# Patient Record
Sex: Male | Born: 1950 | Race: White | Hispanic: No | Marital: Married | Smoking: Never smoker
Health system: Southern US, Community
[De-identification: ages and names within clinical notes are randomized; demographics above are authoritative.]

## PROBLEM LIST (undated history)

## (undated) DIAGNOSIS — J45909 Unspecified asthma, uncomplicated: Secondary | ICD-10-CM

## (undated) DIAGNOSIS — Z96649 Presence of unspecified artificial hip joint: Secondary | ICD-10-CM

## (undated) HISTORY — PX: NASAL SINUS SURGERY: SHX719

## (undated) HISTORY — DX: Unspecified asthma, uncomplicated: J45.909

## (undated) HISTORY — PX: AORTIC VALVE REPLACEMENT: SHX41

## (undated) HISTORY — DX: Presence of unspecified artificial hip joint: Z96.649

## (undated) HISTORY — PX: HIATAL HERNIA REPAIR: SHX195

## (undated) NOTE — *Deleted (*Deleted)
° °[address] °[place] [address] °Dept: [phone] ° °FOLLOW UP NOTE ° °Patient ID: Caleb Mills, male    DOB: 07/01/1951  Age: 69 y.o. MRN: [id number] °Date of Office Visit: 04/20/2020 ° °Assessment  °Chief Complaint: Asthma and Shortness of Breath ° °HPI °Caleb Mills is a 69-year-old male who presents to the clinic for follow-up visit.  He was last seen in this clinic on 03/24/2020 for evaluation of asthma, allergic rhinitis, allergic conjunctivitis, reflux, and cough.  At that time he was prescribed cefdinir and prednisone with relief of cough symptoms.  He reports his asthma as severe with symptoms including shortness of breath with any activity, decrease cough, and decreased wheeze.  He reports that he is unable to do any activity for greater than 2 minutes without stopping to use albuterol to decrease shortness of breath.  He reports this has been occurring since he had COVID-19 in August 2021.  He continues budesonide 0.5 mg twice a day and Perforomist twice a day via nebulizer.  He reports that he still uses albuterol frequently.  He continues Xolair 225 mg injections once every 2 weeks.  Allergic rhinitis is reported as doing much better with occasional nasal congestion and rhinorrhea for which he uses Flonase, azelastine, and saline nasal rinses as needed.  Allergic conjunctivitis is reported as well controlled with Pataday as needed.  Reflux is reported as well controlled while continuing dietary modifications and limiting coffee intake.  His current medications are listed in the chart. ° ° °Drug Allergies:  °Allergies  °Allergen Reactions  °• Augmentin [Amoxicillin-Pot Clavulanate] Nausea Only  °• Ciprofloxacin Other (See Comments)  °  Joint pain  °• Azelastine Other (See Comments)  °  Dry eyes  ° ° °Physical Exam: °BP 120/78    Pulse 77    Temp (!) 97.4 °F (36.3 °C) (Tympanic)    Resp 18    SpO2 98%   ° °Physical Exam °Vitals reviewed.  °Constitutional:   °   Appearance: Normal appearance. He is  well-developed.  °HENT:  °   Head: Normocephalic and atraumatic.  °   Right Ear: Tympanic membrane normal.  °   Left Ear: Tympanic membrane normal.  °   Nose:  °   Comments: Bilateral nares normal.  Septal deviation noted.  Pharynx normal.  Ears normal.  Eyes normal. °   Mouth/Throat:  °   Pharynx: Oropharynx is clear.  °Eyes:  °   Conjunctiva/sclera: Conjunctivae normal.  °Cardiovascular:  °   Rate and Rhythm: Normal rate and regular rhythm.  °   Heart sounds: Normal heart sounds. No murmur heard.  ° °Pulmonary:  °   Effort: Pulmonary effort is normal.  °   Breath sounds: Normal breath sounds.  °   Comments: Lungs clear to auscultation °Musculoskeletal:     °   General: Normal range of motion.  °   Cervical back: Normal range of motion and neck supple.  °Skin: °   General: Skin is warm and dry.  °Neurological:  °   Mental Status: He is alert and oriented to person, place, and time.  °Psychiatric:     °   Mood and Affect: Mood normal.     °   Behavior: Behavior normal.     °   Thought Content: Thought content normal.     °   Judgment: Judgment normal.  ° ° ° °Diagnostics: °FVC 2.93, FEV1 1.14.  Predicted FVC 3.57, predicted FEV1 2.60.  Spirometry indicates   severe airway obstruction.  This is consistent with previous spirometry readings. ° °Assessment and Plan: °1. Severe persistent asthma, unspecified whether complicated   °2. Other allergic rhinitis   °3. Gastroesophageal reflux disease, unspecified whether esophagitis present   °4. Seasonal allergic conjunctivitis   °5. History of aortic valve replacement   °6. Prophylactic use of warfarin for venous thromboembolism   °7. Asthma-COPD overlap syndrome (HCC)   ° ° °Patient Instructions  °Asthma °Continue montelukast 10 mg once a day to prevent cough or wheeze °For now, continue budesonide 0.5 mg twice a day via nebulizer and Perforomist twice a day via nebulizer. Evaluate these medications with Dr. Ejaz at your upcoming appointment °Continue albuterol 2 puffs every  4 hours as needed for cough or wheeze OR Instead use albuterol 0.083% solution via nebulizer one unit vial every 4 hours as needed for cough or wheeze °Continue Xolair injections once every 2 weeks  ° °Allergic rhinitis °Continue Flonase 2 sprays in each nostril once a day as needed for a stuffy nose °Continue azelastine 2 sprays in each nostril twice a day as needed for a runny nose °Consider saline nasal rinses as needed for nasal symptoms. Use this before any medicated nasal sprays for best result °Continue Claritin 10 mg once a day as needed for a runny nose °For thick nasal secretions, begin Mucinex 1200 mg twice a day and increase hydration as possible ° °Allergic conjunctivitis °Continue a lubricating eye drop °Some over the counter eye drops include Pataday one drop in each eye once a day as needed for red, itchy eyes OR Zaditor one drop in each eye twice a day as needed for red itchy eyes. ° °Reflux °Continue dietary and lifestyle modifications as listed below ° °Call the clinic if this treatment plan is not working well for you ° °Follow up in 6 weeks or sooner if needed. ° ° °Return in about 6 weeks (around 06/01/2020), or if symptoms worsen or fail to improve. °  ° °Thank you for the opportunity to care for this patient.  Please do not hesitate to contact me with questions. ° °Anne Ambs, FNP °[facility] ° ° ° ° ° °

---

## 1971-07-05 HISTORY — PX: APPENDECTOMY: SHX54

## 1994-07-04 HISTORY — PX: CHOLECYSTECTOMY: SHX55

## 2001-07-04 HISTORY — PX: THYROIDECTOMY: SHX17

## 2007-07-05 DIAGNOSIS — Z96649 Presence of unspecified artificial hip joint: Secondary | ICD-10-CM

## 2007-07-05 HISTORY — PX: ROTATOR CUFF REPAIR: SHX139

## 2007-07-05 HISTORY — DX: Presence of unspecified artificial hip joint: Z96.649

## 2007-07-05 HISTORY — PX: HIP RESURFACING: SHX1760

## 2007-07-05 HISTORY — PX: REPLACEMENT TOTAL HIP W/  RESURFACING IMPLANTS: SUR1222

## 2012-07-04 HISTORY — PX: HIP RESURFACING: SHX1760

## 2013-01-01 HISTORY — PX: TOTAL HIP ARTHROPLASTY: SHX124

## 2015-03-06 DIAGNOSIS — J3089 Other allergic rhinitis: Secondary | ICD-10-CM | POA: Insufficient documentation

## 2015-03-06 DIAGNOSIS — J4550 Severe persistent asthma, uncomplicated: Secondary | ICD-10-CM | POA: Insufficient documentation

## 2015-03-13 ENCOUNTER — Other Ambulatory Visit: Payer: Self-pay

## 2015-03-13 MED ORDER — OMALIZUMAB 150 MG ~~LOC~~ SOLR
225.0000 mg | SUBCUTANEOUS | Status: DC
  Administered 2015-04-02 – 2018-10-02 (×86): 225 mg via SUBCUTANEOUS

## 2015-03-31 ENCOUNTER — Ambulatory Visit: Payer: Medicare Other | Admitting: *Deleted

## 2015-04-02 ENCOUNTER — Ambulatory Visit (INDEPENDENT_AMBULATORY_CARE_PROVIDER_SITE_OTHER): Payer: Medicare Other | Admitting: *Deleted

## 2015-04-02 DIAGNOSIS — J4540 Moderate persistent asthma, uncomplicated: Secondary | ICD-10-CM | POA: Diagnosis not present

## 2015-04-02 DIAGNOSIS — J3089 Other allergic rhinitis: Secondary | ICD-10-CM

## 2015-04-03 DIAGNOSIS — J4540 Moderate persistent asthma, uncomplicated: Secondary | ICD-10-CM

## 2015-04-21 ENCOUNTER — Other Ambulatory Visit: Payer: Self-pay | Admitting: Allergy

## 2015-04-21 ENCOUNTER — Telehealth: Payer: Self-pay | Admitting: Allergy

## 2015-04-21 MED ORDER — ALBUTEROL SULFATE HFA 108 (90 BASE) MCG/ACT IN AERS: 2.0000 | INHALATION_SPRAY | Freq: Four times a day (QID) | RESPIRATORY_TRACT | Status: DC | PRN

## 2015-04-30 ENCOUNTER — Ambulatory Visit (INDEPENDENT_AMBULATORY_CARE_PROVIDER_SITE_OTHER): Payer: Medicare Other | Admitting: Pediatrics

## 2015-04-30 ENCOUNTER — Encounter: Payer: Self-pay | Admitting: Pediatrics

## 2015-04-30 VITALS — BP 110/80 | HR 72 | Temp 98.4°F | Resp 16 | Ht 64.17 in | Wt 157.0 lb

## 2015-04-30 DIAGNOSIS — J4550 Severe persistent asthma, uncomplicated: Secondary | ICD-10-CM | POA: Diagnosis not present

## 2015-04-30 DIAGNOSIS — J301 Allergic rhinitis due to pollen: Secondary | ICD-10-CM | POA: Diagnosis not present

## 2015-04-30 DIAGNOSIS — Z954 Presence of other heart-valve replacement: Secondary | ICD-10-CM

## 2015-04-30 DIAGNOSIS — Z952 Presence of prosthetic heart valve: Secondary | ICD-10-CM | POA: Insufficient documentation

## 2015-04-30 MED ORDER — ALBUTEROL SULFATE (2.5 MG/3ML) 0.083% IN NEBU: 2.5000 mg | INHALATION_SOLUTION | RESPIRATORY_TRACT | Status: DC | PRN

## 2015-04-30 NOTE — Patient Instructions (Signed)
Perforomist 20 mg by inhalation followed 20 minutes later by budesonide 0.5 mg twice a day Albuterol 0.083% one unit dose every 4 hours if needed for coughing or wheezing Ventolin 2 puffs every 4 hours if needed for coughing or wheezing when he is away from the nebulizer Continue Xolair injections every 2 weeks Claritin 10 mg once a day if needed for runny nose Fluticasone 2 sprays per nostril once a day if needed for stuffy nose He will call me if he is not doing well on this treatment plan. I feel that we should reduce the use of albuterol or Ventolin unless they're necessary because of shortness of breath

## 2015-04-30 NOTE — Progress Notes (Signed)
[address] [place] [address] Dept: [phone]  FOLLOW UP NOTE  Patient ID: Caleb Mills, male    DOB: [birth year]  Age: 64 y.o. MRN: [id number] Date of Office Visit: 04/30/2015  Assessment Chief Complaint: Follow-up  HPI Tyhir Schwan presents for follow-up of his severe asthma. He is on Xolair injections every 2 weeks and feels that they have helped him greatly. He stopped using Singulair and he did not note any deterioration in his breathing. He had a flu vaccination in September. In the past she has been allergic to dust mites, grass pollens, tree pollens, ragweed and weeds, and molds including Aspergillus.  Current medications are budesonide 0.5 one unit dose twice a day after using Perforomist 20 mg inhalation twice a day, albuterol 0.083% one unit dose every 4 hours if needed or instead Ventolin 2 puffs every 4 hours if needed. His pulmonologist in the past, wanted him to use Ventolin nebulizer before Perforomist.. He is also on Claritin 10 mg once a day if needed for runny nose and fluticasone 2 sprays per nostril once a day if needed for stuffy nose and on Coumadin. He had an aortic valve replaced in the past.   Drug Allergies:  Allergies  Allergen Reactions  . Augmentin [Amoxicillin-Pot Clavulanate] Nausea Only  . Spiriva Handihaler [Tiotropium Bromide Monohydrate] Other (See Comments)    Congestion worse    Physical Exam: BP 110/80 mmHg  Pulse 72  Temp(Src) 98.4 F (36.9 C) (Oral)  Resp 16  Ht 5' 4.17" (1.63 m)  Wt 156 lb 15.5 oz (71.2 kg)  BMI 26.80 kg/m2   Physical Exam  Constitutional: He is oriented to person, place, and time. He appears well-developed and well-nourished.  HENT:  Eyes normal. Ears normal. Nose normal. Pharynx normal.  Neck: Neck supple.  Cardiovascular:  S1 and S2 normal no murmurs  Pulmonary/Chest:  Clear to percussion and auscultation He had an increased anteroposterior diameter  Lymphadenopathy:    He has no cervical adenopathy.    Neurological: He is alert and oriented to person, place, and time.  Vitals reviewed.   Diagnostics:  Forced hot capacity 2.64 L FEV1 1.16 L. Predicted FVC 3.63 L predicted FEV1 2.69 L-this shows a mild reduction in the forced vital capacity. The FEV1 is 43% of predicted.  Assessment and Plan: 1. Severe persistent asthma, uncomplicated   2. Allergic rhinitis due to pollen   3. H/O aortic valve replacement     Meds ordered this encounter  Medications  . albuterol (PROVENTIL) (2.5 MG/3ML) 0.083% nebulizer solution    Sig: Take 3 mLs (2.5 mg total) by nebulization every 4 (four) hours as needed for wheezing or shortness of breath.    Dispense:  75 mL    Refill:  1    Patient Instructions  Perforomist 20 mg by inhalation followed 20 minutes later by budesonide 0.5 mg twice a day Albuterol 0.083% one unit dose every 4 hours if needed for coughing or wheezing Ventolin 2 puffs every 4 hours if needed for coughing or wheezing when he is away from the nebulizer Continue Xolair injections every 2 weeks Claritin 10 mg once a day if needed for runny nose Fluticasone 2 sprays per nostril once a day if needed for stuffy nose He will call me if he is not doing well on this treatment plan. I feel that we should reduce the use of albuterol or Ventolin unless they're necessary because of shortness of breath    Return in about 6 months (  around 10/29/2015).    Thank you for the opportunity to care for this patient.  Please do not hesitate to contact me with questions.  Tonette BihariJ. A. Grainger Mccarley, M.D.  [facility] [address] [place], [place] [id number] [phone]

## 2015-05-05 NOTE — Telephone Encounter (Signed)
ERRONEOUS ENCOUNTER

## 2015-05-14 ENCOUNTER — Ambulatory Visit (INDEPENDENT_AMBULATORY_CARE_PROVIDER_SITE_OTHER): Payer: Medicare Other | Admitting: *Deleted

## 2015-05-14 DIAGNOSIS — J4540 Moderate persistent asthma, uncomplicated: Secondary | ICD-10-CM | POA: Diagnosis not present

## 2015-05-15 DIAGNOSIS — J4540 Moderate persistent asthma, uncomplicated: Secondary | ICD-10-CM | POA: Diagnosis not present

## 2015-06-04 ENCOUNTER — Ambulatory Visit (INDEPENDENT_AMBULATORY_CARE_PROVIDER_SITE_OTHER): Payer: Medicare Other | Admitting: *Deleted

## 2015-06-04 DIAGNOSIS — J4550 Severe persistent asthma, uncomplicated: Secondary | ICD-10-CM | POA: Diagnosis not present

## 2015-06-05 ENCOUNTER — Other Ambulatory Visit: Payer: Self-pay

## 2015-06-05 DIAGNOSIS — J4550 Severe persistent asthma, uncomplicated: Secondary | ICD-10-CM | POA: Diagnosis not present

## 2015-06-05 MED ORDER — BUDESONIDE 0.5 MG/2ML IN SUSP: 0.5000 mg | Freq: Two times a day (BID) | RESPIRATORY_TRACT | Status: DC

## 2015-06-18 ENCOUNTER — Ambulatory Visit (INDEPENDENT_AMBULATORY_CARE_PROVIDER_SITE_OTHER): Payer: Medicare Other | Admitting: *Deleted

## 2015-06-18 DIAGNOSIS — J4540 Moderate persistent asthma, uncomplicated: Secondary | ICD-10-CM

## 2015-06-19 DIAGNOSIS — J4540 Moderate persistent asthma, uncomplicated: Secondary | ICD-10-CM | POA: Diagnosis not present

## 2015-07-02 ENCOUNTER — Ambulatory Visit (INDEPENDENT_AMBULATORY_CARE_PROVIDER_SITE_OTHER): Payer: Medicare Other | Admitting: *Deleted

## 2015-07-02 DIAGNOSIS — J4550 Severe persistent asthma, uncomplicated: Secondary | ICD-10-CM | POA: Diagnosis not present

## 2015-07-15 ENCOUNTER — Other Ambulatory Visit: Payer: Self-pay | Admitting: Allergy

## 2015-07-15 MED ORDER — ALBUTEROL SULFATE (2.5 MG/3ML) 0.083% IN NEBU: 2.5000 mg | INHALATION_SOLUTION | RESPIRATORY_TRACT | Status: DC | PRN

## 2015-07-15 NOTE — Telephone Encounter (Signed)
ORDERED PERFOROMIST INH SOL PER DR BARDELAS. SEVERE PERSISTENT ASTHMA- NEEDS TO AVOID HOSPITALIZATION.

## 2015-07-16 ENCOUNTER — Ambulatory Visit (INDEPENDENT_AMBULATORY_CARE_PROVIDER_SITE_OTHER): Payer: Medicare Other | Admitting: *Deleted

## 2015-07-16 DIAGNOSIS — J4540 Moderate persistent asthma, uncomplicated: Secondary | ICD-10-CM | POA: Diagnosis not present

## 2015-07-29 ENCOUNTER — Other Ambulatory Visit: Payer: Self-pay | Admitting: Allergy

## 2015-07-29 MED ORDER — FORMOTEROL FUMARATE 20 MCG/2ML IN NEBU: 20.0000 ug | INHALATION_SOLUTION | Freq: Two times a day (BID) | RESPIRATORY_TRACT | Status: DC

## 2015-07-30 ENCOUNTER — Ambulatory Visit (INDEPENDENT_AMBULATORY_CARE_PROVIDER_SITE_OTHER): Payer: Medicare Other | Admitting: *Deleted

## 2015-07-30 DIAGNOSIS — J4540 Moderate persistent asthma, uncomplicated: Secondary | ICD-10-CM

## 2015-07-31 DIAGNOSIS — J4540 Moderate persistent asthma, uncomplicated: Secondary | ICD-10-CM | POA: Diagnosis not present

## 2015-08-17 ENCOUNTER — Ambulatory Visit (INDEPENDENT_AMBULATORY_CARE_PROVIDER_SITE_OTHER): Payer: Medicare Other

## 2015-08-17 DIAGNOSIS — J4540 Moderate persistent asthma, uncomplicated: Secondary | ICD-10-CM | POA: Diagnosis not present

## 2015-08-18 DIAGNOSIS — J4540 Moderate persistent asthma, uncomplicated: Secondary | ICD-10-CM | POA: Diagnosis not present

## 2015-08-27 ENCOUNTER — Ambulatory Visit (INDEPENDENT_AMBULATORY_CARE_PROVIDER_SITE_OTHER): Payer: Medicare Other | Admitting: *Deleted

## 2015-08-27 DIAGNOSIS — J4540 Moderate persistent asthma, uncomplicated: Secondary | ICD-10-CM

## 2015-08-28 DIAGNOSIS — J4540 Moderate persistent asthma, uncomplicated: Secondary | ICD-10-CM | POA: Diagnosis not present

## 2015-09-10 ENCOUNTER — Ambulatory Visit (INDEPENDENT_AMBULATORY_CARE_PROVIDER_SITE_OTHER): Payer: Medicare Other | Admitting: *Deleted

## 2015-09-10 DIAGNOSIS — J4540 Moderate persistent asthma, uncomplicated: Secondary | ICD-10-CM

## 2015-09-11 DIAGNOSIS — J4540 Moderate persistent asthma, uncomplicated: Secondary | ICD-10-CM | POA: Diagnosis not present

## 2015-09-18 ENCOUNTER — Other Ambulatory Visit: Payer: Self-pay | Admitting: Pediatrics

## 2015-09-24 ENCOUNTER — Ambulatory Visit (INDEPENDENT_AMBULATORY_CARE_PROVIDER_SITE_OTHER): Payer: Medicare Other

## 2015-09-24 DIAGNOSIS — J4540 Moderate persistent asthma, uncomplicated: Secondary | ICD-10-CM

## 2015-09-25 DIAGNOSIS — J4540 Moderate persistent asthma, uncomplicated: Secondary | ICD-10-CM | POA: Diagnosis not present

## 2015-10-06 ENCOUNTER — Other Ambulatory Visit: Payer: Self-pay | Admitting: Allergy

## 2015-10-06 MED ORDER — FORMOTEROL FUMARATE 20 MCG/2ML IN NEBU: 20.0000 ug | INHALATION_SOLUTION | Freq: Two times a day (BID) | RESPIRATORY_TRACT | Status: DC

## 2015-10-07 ENCOUNTER — Other Ambulatory Visit: Payer: Self-pay | Admitting: Allergy

## 2015-10-07 MED ORDER — BUDESONIDE 0.5 MG/2ML IN SUSP: 0.5000 mg | Freq: Two times a day (BID) | RESPIRATORY_TRACT | Status: DC

## 2015-10-08 ENCOUNTER — Ambulatory Visit (INDEPENDENT_AMBULATORY_CARE_PROVIDER_SITE_OTHER): Payer: Medicare Other

## 2015-10-08 DIAGNOSIS — J4540 Moderate persistent asthma, uncomplicated: Secondary | ICD-10-CM | POA: Diagnosis not present

## 2015-10-09 ENCOUNTER — Other Ambulatory Visit: Payer: Self-pay

## 2015-10-09 DIAGNOSIS — J4540 Moderate persistent asthma, uncomplicated: Secondary | ICD-10-CM | POA: Diagnosis not present

## 2015-10-09 MED ORDER — BUDESONIDE 0.5 MG/2ML IN SUSP: 0.5000 mg | Freq: Two times a day (BID) | RESPIRATORY_TRACT | Status: DC

## 2015-10-21 DIAGNOSIS — J4540 Moderate persistent asthma, uncomplicated: Secondary | ICD-10-CM | POA: Diagnosis not present

## 2015-10-22 ENCOUNTER — Ambulatory Visit (INDEPENDENT_AMBULATORY_CARE_PROVIDER_SITE_OTHER): Payer: Medicare Other

## 2015-10-22 DIAGNOSIS — J4540 Moderate persistent asthma, uncomplicated: Secondary | ICD-10-CM

## 2015-10-29 ENCOUNTER — Ambulatory Visit (INDEPENDENT_AMBULATORY_CARE_PROVIDER_SITE_OTHER): Payer: Medicare Other | Admitting: Internal Medicine

## 2015-10-29 ENCOUNTER — Encounter: Payer: Self-pay | Admitting: Internal Medicine

## 2015-10-29 ENCOUNTER — Ambulatory Visit: Payer: Medicare Other | Admitting: Internal Medicine

## 2015-10-29 VITALS — BP 130/72 | HR 52 | Temp 98.2°F | Resp 16

## 2015-10-29 DIAGNOSIS — J4550 Severe persistent asthma, uncomplicated: Secondary | ICD-10-CM | POA: Diagnosis not present

## 2015-10-29 DIAGNOSIS — J3089 Other allergic rhinitis: Secondary | ICD-10-CM | POA: Diagnosis not present

## 2015-10-29 LAB — PULMONARY FUNCTION TEST

## 2015-10-29 NOTE — Assessment & Plan Note (Signed)
   On Xolair, currently well controlled  Continue budesonide 0.5 mg twice a day and formoterol 20 g twice a day. May wean to once a day if feeling well  Continue as needed albuterol  Has EpiPen and action plan

## 2015-10-29 NOTE — Progress Notes (Signed)
History of Present Illness: Caleb Mills is a 65 y.o. male presenting for follow-up   HPI Comments: Asthma on Xolair: Symptoms have been stable without any severe exacerbations since his last visit. He continues on budesonide 0.5 mg twice a day and formoterol 20 g twice a day. He is able to wean these to once a day when he is feeling well. He stopped Singulair due to no perceived benefit. He has not had any shot reactions and does feel benefit from his Xolair  Allergic rhinitis: Skin testing in the past was reportedly positive for tree, dust mite, cat, dog. This was performed an outside allergist. He has not had any interval sinus infections and has been having good symptom control   Current Outpatient Prescriptions on File Prior to Visit  Medication Sig Dispense Refill  . albuterol (PROVENTIL) (2.5 MG/3ML) 0.083% nebulizer solution Take 3 mLs (2.5 mg total) by nebulization every 4 (four) hours as needed for wheezing or shortness of breath. 75 mL 1  . Albuterol Sulfate (VENTOLIN HFA IN) Inhale into the lungs.    [name] EPINEPHrine (EPIPEN 2-PAK) 0.3 mg/0.3 mL IJ SOAJ injection Inject 0.3 mg into the muscle as needed.    . fluticasone (FLONASE) 50 MCG/ACT nasal spray Place 1 spray into both nostrils as needed (patient will take one to two sprays each nostril depending upon the weather).     [name] levothyroxine (SYNTHROID, LEVOTHROID) 100 MCG tablet TK 1 T PO D  1  . loratadine (CLARITIN) 10 MG tablet Take 10 mg by mouth daily.    . VENTOLIN HFA 108 (90 Base) MCG/ACT inhaler INHALE 2 PUFFS INTO THE LUNGS EVERY 6 HOURS AS NEEDED FOR WHEEZING OR SHORTNESS OF BREATH 18 g 0  . warfarin (COUMADIN) 10 MG tablet Take 10 mg by mouth. Patient takes warfarin  3 days per week and on other days   1  . formoterol (PERFOROMIST) 20 MCG/2ML nebulizer solution Take 2 mLs (20 mcg total) by nebulization 2 (two) times daily. 120 mL 1   Current Facility-Administered Medications on File Prior to Visit  Medication Dose  Route Frequency Provider Last Rate Last Dose  . omalizumab Geoffry Paradise) injection 225 mg  225 mg Subcutaneous Q14 Days Cristal Ford, MD   225 mg at 10/22/15 1419    Assessment and Plan: Allergic rhinitis  Currently well controlled  Continue loratadine (Claritin) 10 mg daily and fluticasone one spray each nostril daily as needed  Severe persistent asthma  On Xolair, currently well controlled  Continue budesonide 0.5 mg twice a day and formoterol 20 g twice a day. May wean to once a day if feeling well  Continue as needed albuterol  Has EpiPen and action plan   Return in about 6 months (around 04/29/2016).  Meds ordered this encounter  Medications  . budesonide (PULMICORT) 0.5 MG/2ML nebulizer solution    Sig:     Refill:  5    Diagnostics: Spirometry: FEV1 1.25 L or 46 %, FEV1/FVC  37 %.  This is consistent with severe airways obstruction. Partial reversibility in the past.  Physical Exam: BP 130/72 mmHg  Pulse 52  Temp(Src) 98.2 F (36.8 C) (Oral)  Resp 16   Physical Exam  Constitutional: He appears well-developed.  HENT:  Nose: Nose normal.  Mouth/Throat: Oropharynx is clear and moist.  Eyes: Conjunctivae are normal.  Cardiovascular: Normal rate, regular rhythm and normal heart sounds.   No murmur heard. Pulmonary/Chest: Effort normal and breath sounds normal. No respiratory distress. He has  no wheezes.  Abdominal: Soft. Bowel sounds are normal.  Musculoskeletal: He exhibits no edema.  Lymphadenopathy:    He has no cervical adenopathy.  Neurological: He is alert.  Skin: No rash noted.  Vitals reviewed.   Patient Active Problem List   Diagnosis Date Noted  . H/O aortic valve replacement 04/30/2015  . Allergic rhinitis 03/06/2015  . Severe persistent asthma 03/06/2015    Drug Allergies:  Allergies  Allergen Reactions  . Augmentin [Amoxicillin-Pot Clavulanate] Nausea Only  . Spiriva Handihaler [Tiotropium Bromide Monohydrate] Other (See  Comments)    Congestion worse    ROS: Per HPI unless specifically indicated below Review of Systems  Thank you for the opportunity to care for this patient.  Please do not hesitate to contact me with questions.

## 2015-10-29 NOTE — Assessment & Plan Note (Signed)
   Currently well controlled  Continue loratadine (Claritin) 10 mg daily and fluticasone one spray each nostril daily as needed

## 2015-10-29 NOTE — Patient Instructions (Signed)
Allergic rhinitis  Currently well controlled  Continue loratadine (Claritin) 10 mg daily and fluticasone one spray each nostril daily as needed  Severe persistent asthma  On Xolair, currently well controlled  Continue budesonide 0.5 mg twice a day and formoterol 20 g twice a day. May wean to once a day if feeling well  Continue as needed albuterol  Has EpiPen and action plan

## 2015-11-04 DIAGNOSIS — J4540 Moderate persistent asthma, uncomplicated: Secondary | ICD-10-CM | POA: Diagnosis not present

## 2015-11-05 ENCOUNTER — Ambulatory Visit (INDEPENDENT_AMBULATORY_CARE_PROVIDER_SITE_OTHER): Payer: Medicare Other | Admitting: *Deleted

## 2015-11-05 DIAGNOSIS — J4540 Moderate persistent asthma, uncomplicated: Secondary | ICD-10-CM

## 2015-11-19 ENCOUNTER — Ambulatory Visit (INDEPENDENT_AMBULATORY_CARE_PROVIDER_SITE_OTHER): Payer: Medicare Other | Admitting: *Deleted

## 2015-11-19 DIAGNOSIS — J4540 Moderate persistent asthma, uncomplicated: Secondary | ICD-10-CM | POA: Diagnosis not present

## 2015-11-20 DIAGNOSIS — J4540 Moderate persistent asthma, uncomplicated: Secondary | ICD-10-CM | POA: Diagnosis not present

## 2015-12-02 DIAGNOSIS — J4540 Moderate persistent asthma, uncomplicated: Secondary | ICD-10-CM | POA: Diagnosis not present

## 2015-12-03 ENCOUNTER — Ambulatory Visit (INDEPENDENT_AMBULATORY_CARE_PROVIDER_SITE_OTHER): Payer: Medicare Other | Admitting: *Deleted

## 2015-12-03 DIAGNOSIS — J4540 Moderate persistent asthma, uncomplicated: Secondary | ICD-10-CM

## 2015-12-17 ENCOUNTER — Ambulatory Visit (INDEPENDENT_AMBULATORY_CARE_PROVIDER_SITE_OTHER): Payer: Medicare Other

## 2015-12-17 DIAGNOSIS — J4540 Moderate persistent asthma, uncomplicated: Secondary | ICD-10-CM | POA: Diagnosis not present

## 2015-12-18 DIAGNOSIS — J4540 Moderate persistent asthma, uncomplicated: Secondary | ICD-10-CM | POA: Diagnosis not present

## 2015-12-25 ENCOUNTER — Other Ambulatory Visit: Payer: Self-pay

## 2015-12-25 MED ORDER — BUDESONIDE 0.5 MG/2ML IN SUSP: 0.5000 mg | Freq: Two times a day (BID) | RESPIRATORY_TRACT | Status: DC

## 2015-12-25 NOTE — Telephone Encounter (Signed)
Refilled pulmicort 0.5 mg x's 5 sent to [facility] on [address].

## 2015-12-31 ENCOUNTER — Ambulatory Visit (INDEPENDENT_AMBULATORY_CARE_PROVIDER_SITE_OTHER): Payer: Medicare Other | Admitting: *Deleted

## 2015-12-31 DIAGNOSIS — J4540 Moderate persistent asthma, uncomplicated: Secondary | ICD-10-CM

## 2016-01-01 DIAGNOSIS — J4540 Moderate persistent asthma, uncomplicated: Secondary | ICD-10-CM | POA: Diagnosis not present

## 2016-01-14 ENCOUNTER — Ambulatory Visit (INDEPENDENT_AMBULATORY_CARE_PROVIDER_SITE_OTHER): Payer: Medicare Other | Admitting: *Deleted

## 2016-01-14 DIAGNOSIS — J4540 Moderate persistent asthma, uncomplicated: Secondary | ICD-10-CM

## 2016-01-15 DIAGNOSIS — J4540 Moderate persistent asthma, uncomplicated: Secondary | ICD-10-CM | POA: Diagnosis not present

## 2016-01-28 ENCOUNTER — Ambulatory Visit (INDEPENDENT_AMBULATORY_CARE_PROVIDER_SITE_OTHER): Payer: Medicare Other | Admitting: *Deleted

## 2016-01-28 DIAGNOSIS — J4540 Moderate persistent asthma, uncomplicated: Secondary | ICD-10-CM

## 2016-01-29 DIAGNOSIS — J4540 Moderate persistent asthma, uncomplicated: Secondary | ICD-10-CM | POA: Diagnosis not present

## 2016-02-10 DIAGNOSIS — J4540 Moderate persistent asthma, uncomplicated: Secondary | ICD-10-CM | POA: Diagnosis not present

## 2016-02-11 ENCOUNTER — Ambulatory Visit (INDEPENDENT_AMBULATORY_CARE_PROVIDER_SITE_OTHER): Payer: Medicare Other

## 2016-02-11 DIAGNOSIS — J4540 Moderate persistent asthma, uncomplicated: Secondary | ICD-10-CM | POA: Diagnosis not present

## 2016-02-24 DIAGNOSIS — J4540 Moderate persistent asthma, uncomplicated: Secondary | ICD-10-CM | POA: Diagnosis not present

## 2016-02-25 ENCOUNTER — Ambulatory Visit (INDEPENDENT_AMBULATORY_CARE_PROVIDER_SITE_OTHER): Payer: Medicare Other

## 2016-02-25 DIAGNOSIS — J4540 Moderate persistent asthma, uncomplicated: Secondary | ICD-10-CM

## 2016-02-26 ENCOUNTER — Other Ambulatory Visit: Payer: Self-pay

## 2016-02-26 MED ORDER — FORMOTEROL FUMARATE 20 MCG/2ML IN NEBU: 20.0000 ug | INHALATION_SOLUTION | Freq: Two times a day (BID) | RESPIRATORY_TRACT | 0 refills | Status: DC

## 2016-03-01 ENCOUNTER — Other Ambulatory Visit: Payer: Self-pay | Admitting: Allergy

## 2016-03-01 MED ORDER — ALBUTEROL SULFATE HFA 108 (90 BASE) MCG/ACT IN AERS: 2.0000 | INHALATION_SPRAY | RESPIRATORY_TRACT | 1 refills | Status: DC | PRN

## 2016-03-09 DIAGNOSIS — J4540 Moderate persistent asthma, uncomplicated: Secondary | ICD-10-CM | POA: Diagnosis not present

## 2016-03-10 ENCOUNTER — Ambulatory Visit (INDEPENDENT_AMBULATORY_CARE_PROVIDER_SITE_OTHER): Payer: Medicare Other

## 2016-03-10 DIAGNOSIS — J4540 Moderate persistent asthma, uncomplicated: Secondary | ICD-10-CM

## 2016-03-24 ENCOUNTER — Ambulatory Visit (INDEPENDENT_AMBULATORY_CARE_PROVIDER_SITE_OTHER): Payer: Medicare Other

## 2016-03-24 DIAGNOSIS — J4540 Moderate persistent asthma, uncomplicated: Secondary | ICD-10-CM

## 2016-03-31 ENCOUNTER — Other Ambulatory Visit: Payer: Self-pay | Admitting: Allergy

## 2016-03-31 MED ORDER — FORMOTEROL FUMARATE 20 MCG/2ML IN NEBU: 20.0000 ug | INHALATION_SOLUTION | Freq: Two times a day (BID) | RESPIRATORY_TRACT | 0 refills | Status: DC

## 2016-04-06 DIAGNOSIS — J4540 Moderate persistent asthma, uncomplicated: Secondary | ICD-10-CM | POA: Diagnosis not present

## 2016-04-07 ENCOUNTER — Ambulatory Visit (INDEPENDENT_AMBULATORY_CARE_PROVIDER_SITE_OTHER): Payer: Medicare Other

## 2016-04-07 DIAGNOSIS — J4540 Moderate persistent asthma, uncomplicated: Secondary | ICD-10-CM | POA: Diagnosis not present

## 2016-04-20 DIAGNOSIS — J4540 Moderate persistent asthma, uncomplicated: Secondary | ICD-10-CM | POA: Diagnosis not present

## 2016-04-21 ENCOUNTER — Ambulatory Visit (INDEPENDENT_AMBULATORY_CARE_PROVIDER_SITE_OTHER): Payer: Medicare Other

## 2016-04-21 ENCOUNTER — Ambulatory Visit: Payer: Medicare Other

## 2016-04-21 DIAGNOSIS — J4540 Moderate persistent asthma, uncomplicated: Secondary | ICD-10-CM | POA: Diagnosis not present

## 2016-05-01 ENCOUNTER — Other Ambulatory Visit: Payer: Self-pay | Admitting: Pediatrics

## 2016-05-02 ENCOUNTER — Other Ambulatory Visit: Payer: Self-pay | Admitting: Allergy

## 2016-05-02 MED ORDER — FORMOTEROL FUMARATE 20 MCG/2ML IN NEBU: 20.0000 ug | INHALATION_SOLUTION | Freq: Two times a day (BID) | RESPIRATORY_TRACT | 0 refills | Status: DC

## 2016-05-02 MED ORDER — ALBUTEROL SULFATE HFA 108 (90 BASE) MCG/ACT IN AERS: 2.0000 | INHALATION_SPRAY | RESPIRATORY_TRACT | 1 refills | Status: DC | PRN

## 2016-05-04 DIAGNOSIS — J4550 Severe persistent asthma, uncomplicated: Secondary | ICD-10-CM | POA: Diagnosis not present

## 2016-05-04 DIAGNOSIS — J309 Allergic rhinitis, unspecified: Secondary | ICD-10-CM | POA: Diagnosis not present

## 2016-05-04 DIAGNOSIS — J4540 Moderate persistent asthma, uncomplicated: Secondary | ICD-10-CM | POA: Diagnosis not present

## 2016-05-04 DIAGNOSIS — R040 Epistaxis: Secondary | ICD-10-CM | POA: Diagnosis not present

## 2016-05-05 ENCOUNTER — Ambulatory Visit (INDEPENDENT_AMBULATORY_CARE_PROVIDER_SITE_OTHER): Payer: Medicare Other | Admitting: Allergy and Immunology

## 2016-05-05 ENCOUNTER — Ambulatory Visit (INDEPENDENT_AMBULATORY_CARE_PROVIDER_SITE_OTHER): Payer: Medicare Other

## 2016-05-05 ENCOUNTER — Encounter: Payer: Self-pay | Admitting: Allergy and Immunology

## 2016-05-05 VITALS — BP 130/82 | HR 72 | Temp 98.4°F | Resp 16

## 2016-05-05 DIAGNOSIS — R040 Epistaxis: Secondary | ICD-10-CM

## 2016-05-05 DIAGNOSIS — J4550 Severe persistent asthma, uncomplicated: Secondary | ICD-10-CM | POA: Diagnosis not present

## 2016-05-05 DIAGNOSIS — J309 Allergic rhinitis, unspecified: Secondary | ICD-10-CM | POA: Diagnosis not present

## 2016-05-05 DIAGNOSIS — J4540 Moderate persistent asthma, uncomplicated: Secondary | ICD-10-CM

## 2016-05-05 NOTE — Assessment & Plan Note (Signed)
Stable.  This patient will not use nasal steroid sprays due to epistaxis.  Continue appropriate allergen avoidance measures and loratadine 10 mg daily as needed.

## 2016-05-05 NOTE — Assessment & Plan Note (Signed)
   Nasal saline spray and/or nasal saline gel is recommended to moisturize nasal mucosa.  The use of a cool mist humidifier in the bedroom has been recommended  If this problem persists or progresses, otolaryngology evaluation may be warranted to search for/cauterize the culprit vessel.

## 2016-05-05 NOTE — Assessment & Plan Note (Signed)
I have discussed possibility of trying mepolizomab to see if he receives greater symptom relief.  Prior to doing so, he like to check to see what coverage is with his insurance as Xolair is currently covered 100%.  For now, continue omalizumab injections every 2 weeks, budesonide 1 g twice a day via nebulizer, formoterol 20 g twice a day via nebulizer, and albuterol every 4-6 hours as needed.  Subjective and objective measures of pulmonary function will be followed and the treatment plan will be adjusted accordingly.

## 2016-05-05 NOTE — Progress Notes (Signed)
Follow-up Note  RE: Caleb Mills MRN: [id number] DOB: 1950-09-15 Date of Office Visit: 05/05/2016  Primary care provider: Charlette CaffeyParuchuri, Lakshmi P, MD Referring provider: Charlette CaffeyParuchuri, Lakshmi P, MD  History of present illness: Caleb Mills is a 65 y.o. male with severe persistent asthma and allergic rhinitis presents today for follow up.  He was last seen in this clinic on 10/29/2015 by Dr. Clydie BraunBhatti who has since left the practice.  He receives omalizumab injections 2 times per month.  He started omalizumab approximately 8 years ago and reports that this treatment has significantly decreased his prednisone and antibiotic requirement.  He states that prior to starting Xolair he had required high-dose steroids as well as antibiotics approximately every 3 months on average for asthma exacerbations.  He currently takes budesonide 1 g via nebulizer twice a day and formoterol 20 g/2 ML twice a day via nebulizer.  On this regimen, he typically requires albuterol rescue one or 2 times per day, more often if he is exerting himself physically while at work.  He has failed Spiriva.  He reports that he had to discontinue fluticasone nasal spray due to frequent epistaxis.  At times, he experiences minor epistaxis on a daily basis.   Assessment and plan: Severe persistent asthma I have discussed possibility of trying mepolizomab to see if he receives greater symptom relief.  Prior to doing so, he like to check to see what coverage is with his insurance as Xolair is currently covered 100%.  For now, continue omalizumab injections every 2 weeks, budesonide 1 g twice a day via nebulizer, formoterol 20 g twice a day via nebulizer, and albuterol every 4-6 hours as needed.  Subjective and objective measures of pulmonary function will be followed and the treatment plan will be adjusted accordingly.  Allergic rhinitis Stable.  This patient will not use nasal steroid sprays due to epistaxis.  Continue appropriate allergen  avoidance measures and loratadine 10 mg daily as needed.  Epistaxis  Nasal saline spray and/or nasal saline gel is recommended to moisturize nasal mucosa.  The use of a cool mist humidifier in the bedroom has been recommended  If this problem persists or progresses, otolaryngology evaluation may be warranted to search for/cauterize the culprit vessel.   Diagnostics: Spirometry reveals an FVC of 2.47 L (69% predicted) and an FEV1 of 1.02 L (38% predicted) with significant (230 mL, 23%) post bronchodilator improvement.  Please see scanned spirometry results for details.    Physical examination: Blood pressure 130/82, pulse 72, temperature 98.4 F (36.9 C), temperature source Oral, resp. rate 16.  General: Alert, interactive, in no acute distress. HEENT: TMs pearly gray, turbinates mildly edematous without discharge, post-pharynx moderately erythematous. Neck: Supple without lymphadenopathy. Lungs: Decreased breath sounds bilaterally without wheezing, rhonchi or rales. CV: Normal S1, S2 without murmurs. Skin: Warm and dry, without lesions or rashes.  The following portions of the patient's history were reviewed and updated as appropriate: allergies, current medications, past family history, past medical history, past social history, past surgical history and problem list.    Medication List       Accurate as of 05/05/16  1:38 PM. Always use your most recent med list.          albuterol 108 (90 Base) MCG/ACT inhaler Commonly known as:  VENTOLIN HFA Inhale 2 puffs into the lungs every 4 (four) hours as needed for wheezing or shortness of breath.   albuterol (2.5 MG/3ML) 0.083% nebulizer solution Commonly known as:  PROVENTIL INHALE CONTENTS  OF ONE VIAL VIA NEBULIZER EVERY FOUR HOURS AS NEEDED FOR WHEEZING OR SHORTNESS OF BREATH   budesonide 0.5 MG/2ML nebulizer solution Commonly known as:  PULMICORT Take 2 mLs (0.5 mg total) by nebulization 2 (two) times daily.   EPIPEN  2-PAK 0.3 mg/0.3 mL Soaj injection Generic drug:  EPINEPHrine Inject 0.3 mg into the muscle as needed.   fluticasone 50 MCG/ACT nasal spray Commonly known as:  FLONASE Place 1 spray into both nostrils as needed (patient will take one to two sprays each nostril depending upon the weather).   formoterol 20 MCG/2ML nebulizer solution Commonly known as:  PERFOROMIST Take 2 mLs (20 mcg total) by nebulization 2 (two) times daily.   levothyroxine 100 MCG tablet Commonly known as:  SYNTHROID, LEVOTHROID TK 1 T PO D   loratadine 10 MG tablet Commonly known as:  CLARITIN Take 10 mg by mouth daily.   warfarin 10 MG tablet Commonly known as:  COUMADIN Take 10 mg by mouth. Patient takes warfarin 10mg  3 days per week and on other days 5mg        Allergies  Allergen Reactions  . Augmentin [Amoxicillin-Pot Clavulanate] Nausea Only  . Spiriva Handihaler [Tiotropium Bromide Monohydrate] Other (See Comments)    Congestion worse   Review of systems: Review of systems negative except as noted in HPI / PMHx or noted below: Constitutional: Negative.  HENT: Negative.   Eyes: Negative.  Respiratory: Negative.   Cardiovascular: Negative.  Gastrointestinal: Negative.  Genitourinary: Negative.  Musculoskeletal: Negative.  Neurological: Negative.  Endo/Heme/Allergies: Negative.  Cutaneous: Negative.  Past Medical History:  Diagnosis Date  . 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 Asthma Mother   . Allergic rhinitis Neg Hx   . Angioedema Neg Hx   . Atopy Neg Hx   . Eczema Neg Hx   . Immunodeficiency Neg Hx   . Urticaria Neg Hx     Social History   Social History  . Marital status: Married    Spouse name: N/A  . Number of children: N/A  . Years of education: N/A   Occupational History  . Not on file.   Social History Main Topics  . Smoking status: Never Smoker  . Smokeless tobacco: Never Used  . Alcohol use No  .  Drug use: No  . Sexual activity: Not on file   Other Topics Concern  . Not on file   Social History Narrative  . No narrative on file    I appreciate the opportunity to take part in [name]'s care. Please do not hesitate to contact me with questions.  Sincerely,   R. Jorene Guestarter Damico Partin, MD

## 2016-05-05 NOTE — Patient Instructions (Signed)
Severe persistent asthma I have discussed possibility of trying mepolizomab to see if he receives greater symptom relief.  Prior to doing so, he like to check to see what coverage is with his insurance as Xolair is currently covered 100%.  For now, continue omalizumab injections every 2 weeks, budesonide 1 g twice a day via nebulizer, formoterol 20 g twice a day via nebulizer, and albuterol every 4-6 hours as needed.  Subjective and objective measures of pulmonary function will be followed and the treatment plan will be adjusted accordingly.  Allergic rhinitis Stable.  This patient will not use nasal steroid sprays due to epistaxis.  Continue appropriate allergen avoidance measures and loratadine 10 mg daily as needed.  Epistaxis  Nasal saline spray and/or nasal saline gel is recommended to moisturize nasal mucosa.  The use of a cool mist humidifier in the bedroom has been recommended  If this problem persists or progresses, otolaryngology evaluation may be warranted to search for/cauterize the culprit vessel.   Return in about 4 months (around 09/02/2016), or if symptoms worsen or fail to improve.

## 2016-05-16 ENCOUNTER — Ambulatory Visit (INDEPENDENT_AMBULATORY_CARE_PROVIDER_SITE_OTHER): Payer: Medicare Other

## 2016-05-16 DIAGNOSIS — J4540 Moderate persistent asthma, uncomplicated: Secondary | ICD-10-CM | POA: Diagnosis not present

## 2016-05-17 ENCOUNTER — Other Ambulatory Visit: Payer: Self-pay | Admitting: Allergy

## 2016-05-17 DIAGNOSIS — J4540 Moderate persistent asthma, uncomplicated: Secondary | ICD-10-CM | POA: Diagnosis not present

## 2016-05-19 ENCOUNTER — Ambulatory Visit: Payer: Medicare Other

## 2016-05-20 ENCOUNTER — Telehealth: Payer: Self-pay

## 2016-05-20 NOTE — Telephone Encounter (Signed)
Dr Nunzio CobbsBobbitt, Patient called me back.  He talked things over with his wife and he has decided to stay on Xolair and not forge ahead with change to [facility].  He is doing good on Xolair and his insurance is paying 100%.  Please discontinue [facility] process [facility]. Thanks! Gwenneth Whiteman

## 2016-05-23 NOTE — Telephone Encounter (Signed)
Noted. Thanks.

## 2016-05-30 ENCOUNTER — Other Ambulatory Visit: Payer: Self-pay

## 2016-05-30 NOTE — Telephone Encounter (Signed)
Patient called.  Coughing and wheezing with yellow sputum Sx. X one week.  Patient feels congestion is getting deeper in chest area today.  Albuterol, Pulmicort Respules and Performist are only helping for about 2 hours, then cough and wheeze begin again.Patient states  prednisone and antibiotics work well for this.  Can we call meds in.  [facility]. [address] in [place].

## 2016-05-30 NOTE — Telephone Encounter (Signed)
Prednisone: 40 mg x3 days, 30 x 3 days, 20 mg x3 day, 10 mg x3 day, 5 mg x 3 days, then stop. Azithromycin, 500 mg on day 1 and 250 mg on days 2 through 5.  Follow up if symptoms persist or progress.

## 2016-05-31 MED ORDER — AZITHROMYCIN 250 MG PO TABS: ORAL_TABLET | ORAL | 0 refills | Status: DC

## 2016-05-31 MED ORDER — PREDNISONE 10 MG PO TABS: ORAL_TABLET | ORAL | 0 refills | Status: DC

## 2016-05-31 NOTE — Addendum Note (Signed)
Addended by: Maryjean MornFREEMAN, Lido Maske D on: 05/31/2016 10:27 AM   Modules accepted: Orders

## 2016-06-01 DIAGNOSIS — J4540 Moderate persistent asthma, uncomplicated: Secondary | ICD-10-CM | POA: Diagnosis not present

## 2016-06-02 ENCOUNTER — Ambulatory Visit (INDEPENDENT_AMBULATORY_CARE_PROVIDER_SITE_OTHER): Payer: Medicare Other

## 2016-06-02 DIAGNOSIS — J4540 Moderate persistent asthma, uncomplicated: Secondary | ICD-10-CM | POA: Diagnosis not present

## 2016-06-15 ENCOUNTER — Other Ambulatory Visit: Payer: Self-pay | Admitting: Allergy

## 2016-06-15 DIAGNOSIS — J4540 Moderate persistent asthma, uncomplicated: Secondary | ICD-10-CM

## 2016-06-15 MED ORDER — ALBUTEROL SULFATE (2.5 MG/3ML) 0.083% IN NEBU: INHALATION_SOLUTION | RESPIRATORY_TRACT | 1 refills | Status: DC

## 2016-06-16 ENCOUNTER — Ambulatory Visit (INDEPENDENT_AMBULATORY_CARE_PROVIDER_SITE_OTHER): Payer: Medicare Other

## 2016-06-16 DIAGNOSIS — J4540 Moderate persistent asthma, uncomplicated: Secondary | ICD-10-CM | POA: Diagnosis not present

## 2016-06-29 DIAGNOSIS — J4540 Moderate persistent asthma, uncomplicated: Secondary | ICD-10-CM

## 2016-06-30 ENCOUNTER — Ambulatory Visit: Payer: Medicare Other

## 2016-06-30 ENCOUNTER — Ambulatory Visit (INDEPENDENT_AMBULATORY_CARE_PROVIDER_SITE_OTHER): Payer: Medicare Other

## 2016-06-30 DIAGNOSIS — J4540 Moderate persistent asthma, uncomplicated: Secondary | ICD-10-CM | POA: Diagnosis not present

## 2016-07-01 ENCOUNTER — Other Ambulatory Visit: Payer: Self-pay | Admitting: *Deleted

## 2016-07-01 MED ORDER — FORMOTEROL FUMARATE 20 MCG/2ML IN NEBU: 20.0000 ug | INHALATION_SOLUTION | Freq: Two times a day (BID) | RESPIRATORY_TRACT | 2 refills | Status: DC

## 2016-07-13 ENCOUNTER — Telehealth: Payer: Self-pay | Admitting: Allergy

## 2016-07-13 NOTE — Telephone Encounter (Signed)
na

## 2016-07-14 ENCOUNTER — Encounter: Payer: Self-pay | Admitting: Allergy and Immunology

## 2016-07-14 ENCOUNTER — Ambulatory Visit: Payer: Medicare Other

## 2016-07-14 ENCOUNTER — Other Ambulatory Visit: Payer: Self-pay

## 2016-07-14 ENCOUNTER — Ambulatory Visit (INDEPENDENT_AMBULATORY_CARE_PROVIDER_SITE_OTHER): Payer: Medicare Other | Admitting: Allergy and Immunology

## 2016-07-14 VITALS — BP 116/76 | HR 62 | Temp 98.3°F | Resp 16

## 2016-07-14 DIAGNOSIS — J45901 Unspecified asthma with (acute) exacerbation: Secondary | ICD-10-CM

## 2016-07-14 DIAGNOSIS — J309 Allergic rhinitis, unspecified: Secondary | ICD-10-CM

## 2016-07-14 DIAGNOSIS — R040 Epistaxis: Secondary | ICD-10-CM | POA: Diagnosis not present

## 2016-07-14 MED ORDER — ALBUTEROL SULFATE (2.5 MG/3ML) 0.083% IN NEBU: INHALATION_SOLUTION | RESPIRATORY_TRACT | 1 refills | Status: DC

## 2016-07-14 MED ORDER — PREDNISONE 1 MG PO TABS: 10.0000 mg | ORAL_TABLET | Freq: Every day | ORAL | Status: DC

## 2016-07-14 MED ORDER — AZITHROMYCIN 500 MG PO TABS: ORAL_TABLET | ORAL | 0 refills | Status: DC

## 2016-07-14 NOTE — Assessment & Plan Note (Addendum)
   Prednisone has been provided: 50 mg 2 days, 40 mg 2 days, 30 mg 2 days, 20 mg 2 days, 10 mg 2 days, then stop.  A prescription has been provided for azithromycin, 500 mg on day 1 and 250 mg on days 2 through 5. He know to decrease his coumadin dose while on azithromycin.  For now, continue budesonide 1 g twice a day via nebulizer, formoterol 20 g twice a day via nebulizer, and albuterol every 4-6 hours as needed.  Resume omalizumab after exacerbation has resolved.  The patient has been asked to contact me if his symptoms persist or progress. Otherwise, he may return for follow up in 4 months.

## 2016-07-14 NOTE — Progress Notes (Signed)
Follow-up Note  RE: Caleb Mills MRN: [id number] DOB: 1950-12-21 Date of Office Visit: 07/14/2016  Primary care provider: Charlette CaffeyParuchuri, Lakshmi P, MD Referring provider: Charlette CaffeyParuchuri, Lakshmi P, MD  History of present illness: Caleb Mills is a 66 y.o. male with severe persistent asthma and allergic rhinitis resenting today for sick visit.  He was last seen in this clinic on 05/05/2016.  He reports that over the past few weeks he has been experiencing frequent lower respiration symptoms including coughing, dyspnea, chest tightness, and wheezing.  He also reports that he is expectorating "greenish yellow mucus."  He states that he was recently in contact with a relative with a respiratory tract infection.  He has no significant nasal or sinus symptoms.  He still experiences occasional epistaxis, however maintains that he is able to use his nasal steroid every few days without problems.   Assessment and plan: Asthma with acute exacerbation  Prednisone has been provided: 50 mg 2 days, 40 mg 2 days, 30 mg 2 days, 20 mg 2 days, 10 mg 2 days, then stop.  A prescription has been provided for azithromycin, 500 mg on day 1 and 250 mg on days 2 through 5. He know to decrease his coumadin dose while on azithromycin.  For now, continue budesonide 1 g twice a day via nebulizer, formoterol 20 g twice a day via nebulizer, and albuterol every 4-6 hours as needed.  Resume omalizumab after exacerbation has resolved.  The patient has been asked to contact me if his symptoms persist or progress. Otherwise, he may return for follow up in 4 months.  Allergic rhinitis  Continue appropriate allergen avoidance measures and loratadine 10 mg daily as needed.  I have also recommended nasal saline spray (i.e. Simply Saline) as needed.  History of epistaxis History of epistaxis in patient on coumadin.  Avoid nasal steroid sprays.  Nasal saline spray and/or nasal saline gel is recommended to moisturize nasal  mucosa.  The use of a cool mist humidifier in the bedroom has been recommended.  During epistaxis, oxymetazoline may be used to help stanch blood flow if needed.  If this problem persists or progresses, otolaryngology evaluation may be warranted to search for/cauterize the culprit vessel.   Meds ordered this encounter  Medications  . azithromycin (ZITHROMAX) 500 MG tablet    Sig: Take 500 mg on day 1, then 250 mg on day 2 through 5.    Dispense:  2 tablet    Refill:  0  . predniSONE (DELTASONE) tablet 10 mg    Diagnostics: Spirometry reveals an FVC of 2.75 L (see 76% predicted and an FEV1 of 1.06 L (40% predicted) with 6% post bronchodilator improvement.  Please see scanned spirometry results for details.    Physical examination: Blood pressure 116/76, pulse 62, temperature 98.3 F (36.8 C), temperature source Oral, resp. rate 16.  General: Alert, interactive, in no acute distress. HEENT: TMs pearly gray, turbinates moderately edematous without discharge, post-pharynx mildly erythematous. Neck: Supple without lymphadenopathy. Lungs: Decreased breath sounds bilaterally without wheezing, rhonchi or rales. CV: Normal S1, S2 without murmurs. Skin: Warm and dry, without lesions or rashes.  The following portions of the patient's history were reviewed and updated as appropriate: allergies, current medications, past family history, past medical history, past social history, past surgical history and problem list.  Allergies as of 07/14/2016      Reactions   Augmentin [amoxicillin-pot Clavulanate] Nausea Only   Spiriva Handihaler [tiotropium Bromide Monohydrate] Other (See Comments)   Congestion  worse      Medication List       Accurate as of 07/14/16  2:22 PM. Always use your most recent med list.          albuterol 108 (90 Base) MCG/ACT inhaler Commonly known as:  VENTOLIN HFA Inhale 2 puffs into the lungs every 4 (four) hours as needed for wheezing or shortness of  breath.   albuterol (2.5 MG/3ML) 0.083% nebulizer solution Commonly known as:  PROVENTIL INHALE CONTENTS OF ONE VIAL VIA NEBULIZER EVERY FOUR HOURS AS NEEDED FOR WHEEZING OR SHORTNESS OF BREATH   azithromycin 500 MG tablet Commonly known as:  ZITHROMAX Take 500 mg on day 1, then 250 mg on day 2 through 5.   budesonide 0.5 MG/2ML nebulizer solution Commonly known as:  PULMICORT Take 2 mLs (0.5 mg total) by nebulization 2 (two) times daily.   EPIPEN 2-PAK 0.3 mg/0.3 mL Soaj injection Generic drug:  EPINEPHrine Inject 0.3 mg into the muscle as needed.   fluticasone 50 MCG/ACT nasal spray Commonly known as:  FLONASE Place 1 spray into both nostrils as needed (patient will take one to two sprays each nostril depending upon the weather).   formoterol 20 MCG/2ML nebulizer solution Commonly known as:  PERFOROMIST Take 2 mLs (20 mcg total) by nebulization 2 (two) times daily.   levothyroxine 100 MCG tablet Commonly known as:  SYNTHROID, LEVOTHROID TK 1 T PO D   loratadine 10 MG tablet Commonly known as:  CLARITIN Take 10 mg by mouth daily.   pravastatin 40 MG tablet Commonly known as:  PRAVACHOL   warfarin 10 MG tablet Commonly known as:  COUMADIN Take 10 mg by mouth. Patient takes warfarin 10mg  3 days per week and on other days 5mg        Allergies  Allergen Reactions  . Augmentin [Amoxicillin-Pot Clavulanate] Nausea Only  . Spiriva Handihaler [Tiotropium Bromide Monohydrate] Other (See Comments)    Congestion worse   Review of systems: Review of systems negative except as noted in HPI / PMHx or noted below: Constitutional: Negative.  HENT: Negative.   Eyes: Negative.  Respiratory: Negative.   Cardiovascular: Negative.  Gastrointestinal: Negative.  Genitourinary: Negative.  Musculoskeletal: Negative.  Neurological: Negative.  Endo/Heme/Allergies: Negative.  Cutaneous: Negative.  Past Medical History:  Diagnosis Date  . 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 Asthma Mother   . Allergic rhinitis Neg Hx   . Angioedema Neg Hx   . Atopy Neg Hx   . Eczema Neg Hx   . Immunodeficiency Neg Hx   . Urticaria Neg Hx     Social History   Social History  . Marital status: Married    Spouse name: N/A  . Number of children: N/A  . Years of education: N/A   Occupational History  . Not on file.   Social History Main Topics  . Smoking status: Never Smoker  . Smokeless tobacco: Never Used  . Alcohol use No  . Drug use: No  . Sexual activity: Not on file   Other Topics Concern  . Not on file   Social History Narrative  . No narrative on file    I appreciate the opportunity to take part in Miko's care. Please do not hesitate to contact me with questions.  Sincerely,   R. Jorene Guest, MD

## 2016-07-14 NOTE — Assessment & Plan Note (Addendum)
History of epistaxis in patient on coumadin.  Avoid nasal steroid sprays.  Nasal saline spray and/or nasal saline gel is recommended to moisturize nasal mucosa.  The use of a cool mist humidifier in the bedroom has been recommended.  During epistaxis, oxymetazoline may be used to help stanch blood flow if needed.  If this problem persists or progresses, otolaryngology evaluation may be warranted to search for/cauterize the culprit vessel.

## 2016-07-14 NOTE — Assessment & Plan Note (Addendum)
   Continue appropriate allergen avoidance measures and loratadine 10 mg daily as needed.  I have also recommended nasal saline spray (i.e. Simply Saline) as needed.

## 2016-07-14 NOTE — Patient Instructions (Addendum)
Asthma with acute exacerbation  Prednisone has been provided: 50 mg 2 days, 40 mg 2 days, 30 mg 2 days, 20 mg 2 days, 10 mg 2 days, then stop.  A prescription has been provided for azithromycin, 500 mg on day 1 and 250 mg on days 2 through 5. He know to decrease his coumadin dose while on azithromycin.  For now, continue budesonide 1 g twice a day via nebulizer, formoterol 20 g twice a day via nebulizer, and albuterol every 4-6 hours as needed.  Resume omalizumab after exacerbation has resolved.  The patient has been asked to contact me if his symptoms persist or progress. Otherwise, he may return for follow up in 4 months.  Allergic rhinitis  Continue appropriate allergen avoidance measures and loratadine 10 mg daily as needed.  I have also recommended nasal saline spray (i.e. Simply Saline) as needed.  History of epistaxis History of epistaxis in patient on coumadin.  Avoid nasal steroid sprays.  Nasal saline spray and/or nasal saline gel is recommended to moisturize nasal mucosa.  The use of a cool mist humidifier in the bedroom has been recommended.  During epistaxis, oxymetazoline may be used to help stanch blood flow if needed.  If this problem persists or progresses, otolaryngology evaluation may be warranted to search for/cauterize the culprit vessel.   Return in about 4 months (around 11/11/2016), or if symptoms worsen or fail to improve.

## 2016-07-15 ENCOUNTER — Telehealth: Payer: Self-pay

## 2016-07-15 ENCOUNTER — Telehealth: Payer: Self-pay | Admitting: *Deleted

## 2016-07-15 NOTE — Telephone Encounter (Signed)
Cristal Fordalph Carter Bobbitt, MD  [facility]; [facility]        Fayrene FearingJames apparently had blood work done by his cardiologist sometime over the past few months, please check to see if a CBC with diff was done. We will be assessing his candidacy for benralizumab.     I have contacted the cardiologist office and they are faxing over all labs he had done in oct. I will look and see if there is a CBC.

## 2016-07-15 NOTE — Telephone Encounter (Signed)
-----   Message from Cristal Fordalph Carter Bobbitt, MD sent at 07/14/2016  2:16 PM EST ----- Fayrene FearingJames apparently had blood work done by his cardiologist sometime over the past few months, please check to see if a CBC with diff was done. We will be assessing his candidacy for benralizumab.

## 2016-07-18 NOTE — Telephone Encounter (Addendum)
Called patient.  [facility] [phone] on [address]  [place] for Dr. Stann Oreasario.  Faxed a request to get labs. Received.  Scanned lab report in EPIC.    [facility]

## 2016-07-18 NOTE — Telephone Encounter (Signed)
Having trouble locating it in Epic. Can you fax it to [place] or put it on my desk in [place]? Thanks.

## 2016-07-19 NOTE — Telephone Encounter (Signed)
Sent message to Tammy to start approval.

## 2016-07-19 NOTE — Telephone Encounter (Signed)
Elevated eosinophils. Excellent candidate for benralizumab. Please submit for approval.

## 2016-07-19 NOTE — Telephone Encounter (Signed)
Found blood test in Epic. Placed records on your desk.

## 2016-07-21 ENCOUNTER — Ambulatory Visit: Payer: Medicare Other

## 2016-07-25 ENCOUNTER — Telehealth: Payer: Self-pay

## 2016-07-25 ENCOUNTER — Ambulatory Visit (INDEPENDENT_AMBULATORY_CARE_PROVIDER_SITE_OTHER): Payer: Medicare Other

## 2016-07-25 DIAGNOSIS — J4540 Moderate persistent asthma, uncomplicated: Secondary | ICD-10-CM | POA: Diagnosis not present

## 2016-07-25 NOTE — Telephone Encounter (Signed)
Calling to have xolair mixed at first told him his xolair wasn't here, but called pt. Back and let him know that his Geoffry Paradisexolair was here.

## 2016-07-26 DIAGNOSIS — J4540 Moderate persistent asthma, uncomplicated: Secondary | ICD-10-CM

## 2016-07-28 ENCOUNTER — Ambulatory Visit: Payer: Medicare Other

## 2016-08-07 DIAGNOSIS — J4540 Moderate persistent asthma, uncomplicated: Secondary | ICD-10-CM | POA: Diagnosis not present

## 2016-08-08 ENCOUNTER — Ambulatory Visit (INDEPENDENT_AMBULATORY_CARE_PROVIDER_SITE_OTHER): Payer: Medicare Other

## 2016-08-08 DIAGNOSIS — J4540 Moderate persistent asthma, uncomplicated: Secondary | ICD-10-CM | POA: Diagnosis not present

## 2016-08-19 DIAGNOSIS — J4540 Moderate persistent asthma, uncomplicated: Secondary | ICD-10-CM

## 2016-08-22 ENCOUNTER — Ambulatory Visit (INDEPENDENT_AMBULATORY_CARE_PROVIDER_SITE_OTHER): Payer: Medicare Other

## 2016-08-22 DIAGNOSIS — J4540 Moderate persistent asthma, uncomplicated: Secondary | ICD-10-CM | POA: Diagnosis not present

## 2016-09-02 ENCOUNTER — Other Ambulatory Visit: Payer: Self-pay

## 2016-09-02 DIAGNOSIS — J4540 Moderate persistent asthma, uncomplicated: Secondary | ICD-10-CM | POA: Diagnosis not present

## 2016-09-02 MED ORDER — ALBUTEROL SULFATE (2.5 MG/3ML) 0.083% IN NEBU: INHALATION_SOLUTION | RESPIRATORY_TRACT | 1 refills | Status: DC

## 2016-09-05 ENCOUNTER — Ambulatory Visit (INDEPENDENT_AMBULATORY_CARE_PROVIDER_SITE_OTHER): Payer: Medicare Other

## 2016-09-05 ENCOUNTER — Other Ambulatory Visit: Payer: Self-pay | Admitting: Allergy

## 2016-09-05 DIAGNOSIS — J4540 Moderate persistent asthma, uncomplicated: Secondary | ICD-10-CM | POA: Diagnosis not present

## 2016-09-19 DIAGNOSIS — J4540 Moderate persistent asthma, uncomplicated: Secondary | ICD-10-CM | POA: Diagnosis not present

## 2016-09-20 ENCOUNTER — Ambulatory Visit (INDEPENDENT_AMBULATORY_CARE_PROVIDER_SITE_OTHER): Payer: Medicare Other

## 2016-09-20 DIAGNOSIS — J4540 Moderate persistent asthma, uncomplicated: Secondary | ICD-10-CM | POA: Diagnosis not present

## 2016-10-04 ENCOUNTER — Ambulatory Visit: Payer: Self-pay

## 2016-10-05 DIAGNOSIS — J4540 Moderate persistent asthma, uncomplicated: Secondary | ICD-10-CM

## 2016-10-06 ENCOUNTER — Ambulatory Visit (INDEPENDENT_AMBULATORY_CARE_PROVIDER_SITE_OTHER): Payer: Medicare Other

## 2016-10-06 DIAGNOSIS — J4540 Moderate persistent asthma, uncomplicated: Secondary | ICD-10-CM | POA: Diagnosis not present

## 2016-10-07 ENCOUNTER — Other Ambulatory Visit: Payer: Self-pay

## 2016-10-07 MED ORDER — ALBUTEROL SULFATE HFA 108 (90 BASE) MCG/ACT IN AERS: 2.0000 | INHALATION_SPRAY | RESPIRATORY_TRACT | 1 refills | Status: DC | PRN

## 2016-10-11 ENCOUNTER — Telehealth: Payer: Self-pay | Admitting: Allergy

## 2016-10-11 NOTE — Telephone Encounter (Signed)
.   Patient  called and said insurance  gave him pro-air instead of ventolin. We will try to get PA on ventolin. Patient said he was not getting enough Budesonide to last a month, was having to pick up every fifteen days. Called in prescription for Budesonide 0.5% no refills. And Perforomist  76mcg/2ml  no refills. Informed patient.

## 2016-10-11 NOTE — Telephone Encounter (Signed)
Okay. Sure that he has enough budesonide to last 30 days

## 2016-10-12 NOTE — Telephone Encounter (Signed)
OK 

## 2016-10-19 ENCOUNTER — Other Ambulatory Visit: Payer: Self-pay | Admitting: Allergy

## 2016-10-19 MED ORDER — ALBUTEROL SULFATE (2.5 MG/3ML) 0.083% IN NEBU: INHALATION_SOLUTION | RESPIRATORY_TRACT | 2 refills | Status: DC

## 2016-10-20 ENCOUNTER — Ambulatory Visit (INDEPENDENT_AMBULATORY_CARE_PROVIDER_SITE_OTHER): Payer: Medicare Other

## 2016-10-20 DIAGNOSIS — J4540 Moderate persistent asthma, uncomplicated: Secondary | ICD-10-CM | POA: Diagnosis not present

## 2016-10-21 ENCOUNTER — Ambulatory Visit: Payer: Self-pay

## 2016-11-02 DIAGNOSIS — J4540 Moderate persistent asthma, uncomplicated: Secondary | ICD-10-CM | POA: Diagnosis not present

## 2016-11-03 ENCOUNTER — Ambulatory Visit (INDEPENDENT_AMBULATORY_CARE_PROVIDER_SITE_OTHER): Payer: Medicare Other | Admitting: Allergy and Immunology

## 2016-11-03 ENCOUNTER — Encounter: Payer: Self-pay | Admitting: Allergy and Immunology

## 2016-11-03 ENCOUNTER — Ambulatory Visit (INDEPENDENT_AMBULATORY_CARE_PROVIDER_SITE_OTHER): Payer: Medicare Other

## 2016-11-03 VITALS — BP 134/78 | HR 66 | Temp 98.1°F | Resp 16

## 2016-11-03 DIAGNOSIS — J3089 Other allergic rhinitis: Secondary | ICD-10-CM

## 2016-11-03 DIAGNOSIS — J4540 Moderate persistent asthma, uncomplicated: Secondary | ICD-10-CM

## 2016-11-03 DIAGNOSIS — J4550 Severe persistent asthma, uncomplicated: Secondary | ICD-10-CM | POA: Diagnosis not present

## 2016-11-03 NOTE — Progress Notes (Signed)
Follow-up Note  RE: Caleb Mills MRN: [id number] DOB: Dec 31, 1950 Date of Office Visit: 11/03/2016  Primary care provider: Charlette Caffey, MD Referring provider: Charlette Caffey, MD  History of present illness: Caleb Mills is a 66 y.o. male with persistent asthma on Xolair and allergic rhinitis presenting today for follow up.  He was last seen in this clinic on 07/14/2016.  He reports that since the weather has warmed up his asthma has been well-controlled with his current treatment plan.  He typically uses albuterol 15 minutes prior to vigorous exertion related to work.  He believes that Ventolin HFA works better than ProAir HFA and is wondering if we can see about switching him back to Ventolin.  He has no nasal symptom complaints today and reports that his nasal symptoms are typically well-controlled with fluticasone nasal spray and loratadine.   Assessment and plan: Severe persistent asthma  Continue omalizumab injections, budesonide 0.5 g twice a day via nebulizer, formoterol 20 g twice a day via nebulizer, and albuterol every 4-6 hours as needed as well as 15 minutes prior to vigorous exertion.  Subjective and objective measures of pulmonary function will be followed and the treatment plan will be adjusted accordingly.  Allergic rhinitis Stable.  Continue appropriate allergen avoidance measures, fluticasone nasal spray as needed, and loratadine 10 mg daily as needed.  I have also recommended nasal saline spray (i.e. Simply Saline) as needed.   Diagnostics: Spirometry reveals an FVC of 2.58 L and an FEV1 of 1.14 L.  Mild restrictive and severe obstructive pattern consistent with previous studies.  Please see scanned spirometry results for details.    Physical examination: Blood pressure 134/78, pulse 66, temperature 98.1 F (36.7 C), temperature source Oral, resp. rate 16, SpO2 97 %.  General: Alert, interactive, in no acute distress. HEENT: TMs pearly gray,  turbinates mildly edematous without discharge, post-pharynx mildly erythematous. Neck: Supple without lymphadenopathy. Lungs: Decreased breath sounds bilaterally without wheezing, rhonchi or rales. CV: Normal S1, S2 without murmurs. Skin: Warm and dry, without lesions or rashes.  The following portions of the patient's history were reviewed and updated as appropriate: allergies, current medications, past family history, past medical history, past social history, past surgical history and problem list.  Allergies as of 11/03/2016      Reactions   Augmentin [amoxicillin-pot Clavulanate] Nausea Only   Spiriva Handihaler [tiotropium Bromide Monohydrate] Other (See Comments)   Congestion worse      Medication List       Accurate as of 11/03/16  1:29 PM. Always use your most recent med list.          albuterol 108 (90 Base) MCG/ACT inhaler Commonly known as:  VENTOLIN HFA Inhale 2 puffs into the lungs every 4 (four) hours as needed for wheezing or shortness of breath.   albuterol (2.5 MG/3ML) 0.083% nebulizer solution Commonly known as:  PROVENTIL INHALE CONTENTS OF ONE VIAL VIA NEBULIZER EVERY FOUR HOURS AS NEEDED FOR WHEEZING OR SHORTNESS OF BREATH   budesonide 0.5 MG/2ML nebulizer solution Commonly known as:  PULMICORT Take 2 mLs (0.5 mg total) by nebulization 2 (two) times daily.   EPIPEN 2-PAK 0.3 mg/0.3 mL Soaj injection Generic drug:  EPINEPHrine Inject 0.3 mg into the muscle as needed.   fluticasone 50 MCG/ACT nasal spray Commonly known as:  FLONASE Place 1 spray into both nostrils as needed (patient will take one to two sprays each nostril depending upon the weather).   formoterol 20 MCG/2ML nebulizer solution Commonly  known as:  PERFOROMIST Take 2 mLs (20 mcg total) by nebulization 2 (two) times daily.   levothyroxine 100 MCG tablet Commonly known as:  SYNTHROID, LEVOTHROID TK 1 T PO D   loratadine 10 MG tablet Commonly known as:  CLARITIN Take 10 mg by mouth  daily.   pravastatin 40 MG tablet Commonly known as:  PRAVACHOL   warfarin 10 MG tablet Commonly known as:  COUMADIN Take 10 mg by mouth. Patient takes warfarin 10mg  3 days per week and on other days 5mg        Allergies  Allergen Reactions  . Augmentin [Amoxicillin-Pot Clavulanate] Nausea Only  . Spiriva Handihaler [Tiotropium Bromide Monohydrate] Other (See Comments)    Congestion worse    I appreciate the opportunity to take part in Caleb Mills's care. Please do not hesitate to contact me with questions.  Sincerely,   R. Jorene Guestarter Ramsay Bognar, MD

## 2016-11-03 NOTE — Assessment & Plan Note (Signed)
   Continue omalizumab injections, budesonide 0.5 g twice a day via nebulizer, formoterol 20 g twice a day via nebulizer, and albuterol every 4-6 hours as needed as well as 15 minutes prior to vigorous exertion.  Subjective and objective measures of pulmonary function will be followed and the treatment plan will be adjusted accordingly.

## 2016-11-03 NOTE — Assessment & Plan Note (Signed)
Stable.  Continue appropriate allergen avoidance measures, fluticasone nasal spray as needed, and loratadine 10 mg daily as needed.  I have also recommended nasal saline spray (i.e. Simply Saline) as needed.

## 2016-11-03 NOTE — Patient Instructions (Signed)
Severe persistent asthma  Continue omalizumab injections, budesonide 0.5 g twice a day via nebulizer, formoterol 20 g twice a day via nebulizer, and albuterol every 4-6 hours as needed as well as 15 minutes prior to vigorous exertion.  Subjective and objective measures of pulmonary function will be followed and the treatment plan will be adjusted accordingly.  Allergic rhinitis Stable.  Continue appropriate allergen avoidance measures, fluticasone nasal spray as needed, and loratadine 10 mg daily as needed.  I have also recommended nasal saline spray (i.e. Simply Saline) as needed.   Return in about 4 months (around 03/06/2017), or if symptoms worsen or fail to improve.

## 2016-11-16 DIAGNOSIS — J4540 Moderate persistent asthma, uncomplicated: Secondary | ICD-10-CM

## 2016-11-17 ENCOUNTER — Ambulatory Visit (INDEPENDENT_AMBULATORY_CARE_PROVIDER_SITE_OTHER): Payer: Medicare Other | Admitting: *Deleted

## 2016-11-17 DIAGNOSIS — J4540 Moderate persistent asthma, uncomplicated: Secondary | ICD-10-CM

## 2016-11-25 DIAGNOSIS — J4540 Moderate persistent asthma, uncomplicated: Secondary | ICD-10-CM | POA: Diagnosis not present

## 2016-11-29 ENCOUNTER — Ambulatory Visit (INDEPENDENT_AMBULATORY_CARE_PROVIDER_SITE_OTHER): Payer: Medicare Other

## 2016-11-29 DIAGNOSIS — J4540 Moderate persistent asthma, uncomplicated: Secondary | ICD-10-CM

## 2016-12-01 ENCOUNTER — Ambulatory Visit: Payer: Self-pay

## 2016-12-13 ENCOUNTER — Ambulatory Visit: Payer: Self-pay

## 2016-12-28 DIAGNOSIS — J4540 Moderate persistent asthma, uncomplicated: Secondary | ICD-10-CM

## 2016-12-29 ENCOUNTER — Other Ambulatory Visit: Payer: Self-pay

## 2016-12-29 ENCOUNTER — Ambulatory Visit (INDEPENDENT_AMBULATORY_CARE_PROVIDER_SITE_OTHER): Payer: Medicare Other

## 2016-12-29 DIAGNOSIS — J4540 Moderate persistent asthma, uncomplicated: Secondary | ICD-10-CM | POA: Diagnosis not present

## 2016-12-29 MED ORDER — BUDESONIDE 0.5 MG/2ML IN SUSP: 0.5000 mg | Freq: Two times a day (BID) | RESPIRATORY_TRACT | 3 refills | Status: DC

## 2016-12-29 MED ORDER — FORMOTEROL FUMARATE 20 MCG/2ML IN NEBU: 20.0000 ug | INHALATION_SOLUTION | Freq: Two times a day (BID) | RESPIRATORY_TRACT | 3 refills | Status: DC

## 2016-12-29 NOTE — Telephone Encounter (Signed)
Ok refill Perforomist x 4. Last ov 11/03/16.

## 2016-12-29 NOTE — Telephone Encounter (Signed)
Ok refill budesonide 0.5 mg x 4. Last ov 11/03/16.

## 2017-01-02 ENCOUNTER — Other Ambulatory Visit: Payer: Self-pay

## 2017-01-02 MED ORDER — BUDESONIDE 0.5 MG/2ML IN SUSP: 0.5000 mg | Freq: Two times a day (BID) | RESPIRATORY_TRACT | 3 refills | Status: DC

## 2017-01-02 MED ORDER — ALBUTEROL SULFATE (2.5 MG/3ML) 0.083% IN NEBU: INHALATION_SOLUTION | RESPIRATORY_TRACT | 1 refills | Status: DC

## 2017-01-02 NOTE — Telephone Encounter (Signed)
Refilled albuterol 0.083% x's 1 and pulmicort 0.5mg /672ml x's 3.

## 2017-01-09 ENCOUNTER — Ambulatory Visit: Payer: Self-pay

## 2017-01-09 DIAGNOSIS — J4540 Moderate persistent asthma, uncomplicated: Secondary | ICD-10-CM | POA: Diagnosis not present

## 2017-01-10 ENCOUNTER — Ambulatory Visit (INDEPENDENT_AMBULATORY_CARE_PROVIDER_SITE_OTHER): Payer: Medicare Other

## 2017-01-10 DIAGNOSIS — J4540 Moderate persistent asthma, uncomplicated: Secondary | ICD-10-CM

## 2017-01-11 ENCOUNTER — Ambulatory Visit: Payer: Self-pay

## 2017-01-12 ENCOUNTER — Ambulatory Visit: Payer: Self-pay

## 2017-01-24 ENCOUNTER — Ambulatory Visit: Payer: Self-pay

## 2017-01-25 DIAGNOSIS — J4540 Moderate persistent asthma, uncomplicated: Secondary | ICD-10-CM | POA: Diagnosis not present

## 2017-01-26 ENCOUNTER — Ambulatory Visit (INDEPENDENT_AMBULATORY_CARE_PROVIDER_SITE_OTHER): Payer: Medicare Other

## 2017-01-26 DIAGNOSIS — J4540 Moderate persistent asthma, uncomplicated: Secondary | ICD-10-CM

## 2017-02-03 ENCOUNTER — Other Ambulatory Visit: Payer: Self-pay

## 2017-02-03 MED ORDER — ALBUTEROL SULFATE (2.5 MG/3ML) 0.083% IN NEBU: INHALATION_SOLUTION | RESPIRATORY_TRACT | 1 refills | Status: DC

## 2017-02-08 DIAGNOSIS — J4540 Moderate persistent asthma, uncomplicated: Secondary | ICD-10-CM | POA: Diagnosis not present

## 2017-02-09 ENCOUNTER — Ambulatory Visit (INDEPENDENT_AMBULATORY_CARE_PROVIDER_SITE_OTHER): Payer: Medicare Other

## 2017-02-09 ENCOUNTER — Ambulatory Visit: Payer: Self-pay

## 2017-02-09 DIAGNOSIS — J4540 Moderate persistent asthma, uncomplicated: Secondary | ICD-10-CM | POA: Diagnosis not present

## 2017-02-22 DIAGNOSIS — J4540 Moderate persistent asthma, uncomplicated: Secondary | ICD-10-CM | POA: Diagnosis not present

## 2017-02-23 ENCOUNTER — Ambulatory Visit (INDEPENDENT_AMBULATORY_CARE_PROVIDER_SITE_OTHER): Payer: Medicare Other

## 2017-02-23 DIAGNOSIS — J4540 Moderate persistent asthma, uncomplicated: Secondary | ICD-10-CM

## 2017-03-07 DIAGNOSIS — J4540 Moderate persistent asthma, uncomplicated: Secondary | ICD-10-CM

## 2017-03-08 ENCOUNTER — Ambulatory Visit (INDEPENDENT_AMBULATORY_CARE_PROVIDER_SITE_OTHER): Payer: Medicare Other | Admitting: *Deleted

## 2017-03-08 DIAGNOSIS — J4540 Moderate persistent asthma, uncomplicated: Secondary | ICD-10-CM | POA: Diagnosis not present

## 2017-03-15 ENCOUNTER — Other Ambulatory Visit: Payer: Self-pay | Admitting: Allergy

## 2017-03-15 MED ORDER — ALBUTEROL SULFATE (2.5 MG/3ML) 0.083% IN NEBU: INHALATION_SOLUTION | RESPIRATORY_TRACT | 1 refills | Status: DC

## 2017-03-21 DIAGNOSIS — J4540 Moderate persistent asthma, uncomplicated: Secondary | ICD-10-CM

## 2017-03-22 ENCOUNTER — Ambulatory Visit (INDEPENDENT_AMBULATORY_CARE_PROVIDER_SITE_OTHER): Payer: Medicare Other

## 2017-03-22 DIAGNOSIS — J4540 Moderate persistent asthma, uncomplicated: Secondary | ICD-10-CM | POA: Diagnosis not present

## 2017-04-04 DIAGNOSIS — J4540 Moderate persistent asthma, uncomplicated: Secondary | ICD-10-CM

## 2017-04-05 ENCOUNTER — Ambulatory Visit (INDEPENDENT_AMBULATORY_CARE_PROVIDER_SITE_OTHER): Payer: Medicare Other

## 2017-04-05 DIAGNOSIS — J4540 Moderate persistent asthma, uncomplicated: Secondary | ICD-10-CM

## 2017-04-18 DIAGNOSIS — J4540 Moderate persistent asthma, uncomplicated: Secondary | ICD-10-CM | POA: Diagnosis not present

## 2017-04-19 ENCOUNTER — Ambulatory Visit (INDEPENDENT_AMBULATORY_CARE_PROVIDER_SITE_OTHER): Payer: Medicare Other

## 2017-04-19 DIAGNOSIS — J4540 Moderate persistent asthma, uncomplicated: Secondary | ICD-10-CM

## 2017-04-21 ENCOUNTER — Other Ambulatory Visit: Payer: Self-pay

## 2017-04-21 MED ORDER — ALBUTEROL SULFATE (2.5 MG/3ML) 0.083% IN NEBU: INHALATION_SOLUTION | RESPIRATORY_TRACT | 1 refills | Status: DC

## 2017-05-02 DIAGNOSIS — J4540 Moderate persistent asthma, uncomplicated: Secondary | ICD-10-CM

## 2017-05-03 ENCOUNTER — Ambulatory Visit (INDEPENDENT_AMBULATORY_CARE_PROVIDER_SITE_OTHER): Payer: Medicare Other

## 2017-05-03 DIAGNOSIS — J4540 Moderate persistent asthma, uncomplicated: Secondary | ICD-10-CM

## 2017-05-11 ENCOUNTER — Encounter: Payer: Self-pay | Admitting: Allergy and Immunology

## 2017-05-11 ENCOUNTER — Ambulatory Visit (INDEPENDENT_AMBULATORY_CARE_PROVIDER_SITE_OTHER): Payer: Medicare Other | Admitting: Allergy and Immunology

## 2017-05-11 VITALS — BP 120/90 | HR 70 | Temp 97.7°F | Resp 20

## 2017-05-11 DIAGNOSIS — J3089 Other allergic rhinitis: Secondary | ICD-10-CM

## 2017-05-11 DIAGNOSIS — J4550 Severe persistent asthma, uncomplicated: Secondary | ICD-10-CM

## 2017-05-11 DIAGNOSIS — R040 Epistaxis: Secondary | ICD-10-CM

## 2017-05-11 MED ORDER — ALBUTEROL SULFATE (2.5 MG/3ML) 0.083% IN NEBU: INHALATION_SOLUTION | RESPIRATORY_TRACT | 3 refills | Status: DC

## 2017-05-11 MED ORDER — BUDESONIDE 0.5 MG/2ML IN SUSP: 0.5000 mg | Freq: Two times a day (BID) | RESPIRATORY_TRACT | 3 refills | Status: DC

## 2017-05-11 NOTE — Patient Instructions (Signed)
Severe persistent asthma Stable.  Continue omalizumab injections, budesonide 0.5 g twice a day via nebulizer, formoterol 20 g twice a day via nebulizer, and albuterol every 4-6 hours as needed as well as 15 minutes prior to exertion.  Subjective and objective measures of pulmonary function will be followed and the treatment plan will be adjusted accordingly.  Allergic rhinitis Currently with suboptimal control.  Continue appropriate allergen avoidance measures and fluticasone nasal spray 2 sprays per nostril daily as needed.  Nasal saline lavage (NeilMed) has been recommended as needed and prior to medicated nasal sprays along with instructions for proper administration.  For thick post nasal drainage, add guaifenesin [phone] mg (Mucinex)  twice daily as needed with adequate hydration as discussed.  History of epistaxis Currently quiescent.  Proper nasal spray technique has been discussed and demonstrated.  He verbalized understanding.  Nasal saline rinse, spray, and/or nasal saline gel is recommended to moisturize nasal mucosa.  The use of a cool mist humidifier in the bedroom has been recommended.  During epistaxis, oxymetazoline may be used to help stanch blood flow if needed.  If this problem recurs, persists, or progresses, otolaryngology evaluation may be warranted to search for/cauterize the culprit vessel.   Return in about 4 months (around 09/08/2017), or if symptoms worsen or fail to improve.

## 2017-05-11 NOTE — Assessment & Plan Note (Signed)
Currently quiescent.  Proper nasal spray technique has been discussed and demonstrated.  He verbalized understanding.  Nasal saline rinse, spray, and/or nasal saline gel is recommended to moisturize nasal mucosa.  The use of a cool mist humidifier in the bedroom has been recommended.  During epistaxis, oxymetazoline may be used to help stanch blood flow if needed.  If this problem recurs, persists, or progresses, otolaryngology evaluation may be warranted to search for/cauterize the culprit vessel.

## 2017-05-11 NOTE — Progress Notes (Signed)
Follow-up Note  RE: Caleb Mills MRN: [id number] DOB: 12-Oct-1950 Date of Office Visit: 05/11/2017  Primary care provider: Charlette Caffey, MD Referring provider: Charlette Caffey, MD  History of present illness: Caleb Mills is a 66 y.o. male with persistent asthma on Xolair and allergic rhinitis presenting today for follow up. He reports that his asthma has been relatively stable while complying with his current asthma regimen.  He requires albuterol with moderate exertion, such as climbing 1 flight of stairs.  If he knows he is going to be exerting himself at work, he will use albuterol prophylactically.  He states that Xolair has provided significant improvement.  He did not take his asthma medications this morning prior to his visit. He attempts to control his nasal allergy symptoms with fluticasone nasal spray, however he has noted increased postnasal drainage his wife has complained that he has bad breath recently.  He denies epistaxis in the interval since his previous visit.   Assessment and plan: Severe persistent asthma Stable.  Continue omalizumab injections, budesonide 0.5 g twice a day via nebulizer, formoterol 20 g twice a day via nebulizer, and albuterol every 4-6 hours as needed as well as 15 minutes prior to exertion.  Subjective and objective measures of pulmonary function will be followed and the treatment plan will be adjusted accordingly.  Allergic rhinitis Currently with suboptimal control.  Continue appropriate allergen avoidance measures and fluticasone nasal spray 2 sprays per nostril daily as needed.  Nasal saline lavage (NeilMed) has been recommended as needed and prior to medicated nasal sprays along with instructions for proper administration.  For thick post nasal drainage, add guaifenesin [phone] mg (Mucinex)  twice daily as needed with adequate hydration as discussed.  History of epistaxis Currently quiescent.  Proper nasal spray technique  has been discussed and demonstrated.  He verbalized understanding.  Nasal saline rinse, spray, and/or nasal saline gel is recommended to moisturize nasal mucosa.  The use of a cool mist humidifier in the bedroom has been recommended.  During epistaxis, oxymetazoline may be used to help stanch blood flow if needed.  If this problem recurs, persists, or progresses, otolaryngology evaluation may be warranted to search for/cauterize the culprit vessel.   Meds ordered this encounter  Medications  . albuterol (PROVENTIL) (2.5 MG/3ML) 0.083% nebulizer solution    Sig: INHALE CONTENTS OF ONE VIAL VIA NEBULIZER EVERY FOUR HOURS AS NEEDED FOR WHEEZING OR SHORTNESS OF BREATH    Dispense:  75 mL    Refill:  3    Please dispense 3 boxes  . budesonide (PULMICORT) 0.5 MG/2ML nebulizer solution    Sig: Take 2 mLs (0.5 mg total) 2 (two) times daily by nebulization.    Dispense:  120 mL    Refill:  3    To prevent cough or wheeze.    Diagnostics: Prematurity reveals an FVC of 2.01 L and an FEV1 of 0.88 L with an FEV1 ratio of 59%.  FEV1 is down 240 mL compared with his previous study, however he admits that he has not taken his asthma medications yet today.  Please see scanned spirometry results for details.    Physical examination: Blood pressure 120/90, pulse 70, temperature 97.7 F (36.5 C), temperature source Oral, resp. rate 20, SpO2 96 %.  General: Alert, interactive, in no acute distress. HEENT: TMs pearly gray, turbinates moderately edematous with thick discharge, post-pharynx erythematous. Neck: Supple without lymphadenopathy. Lungs: Decreased breath sounds bilaterally without wheezing, rhonchi or rales. CV: Normal S1,  S2 without murmurs. Skin: Warm and dry, without lesions or rashes.  The following portions of the patient's history were reviewed and updated as appropriate: allergies, current medications, past family history, past medical history, past social history, past surgical  history and problem list.  Allergies as of 05/11/2017      Reactions   Augmentin [amoxicillin-pot Clavulanate] Nausea Only   Spiriva Handihaler [tiotropium Bromide Monohydrate] Other (See Comments)   Congestion worse      Medication List        Accurate as of 05/11/17 12:19 PM. Always use your most recent med list.          albuterol 108 (90 Base) MCG/ACT inhaler Commonly known as:  VENTOLIN HFA Inhale 2 puffs into the lungs every 4 (four) hours as needed for wheezing or shortness of breath.   albuterol (2.5 MG/3ML) 0.083% nebulizer solution Commonly known as:  PROVENTIL INHALE CONTENTS OF ONE VIAL VIA NEBULIZER EVERY FOUR HOURS AS NEEDED FOR WHEEZING OR SHORTNESS OF BREATH   budesonide 0.5 MG/2ML nebulizer solution Commonly known as:  PULMICORT Take 2 mLs (0.5 mg total) 2 (two) times daily by nebulization.   EPIPEN 2-PAK 0.3 mg/0.3 mL Soaj injection Generic drug:  EPINEPHrine Inject 0.3 mg into the muscle as needed.   fluticasone 50 MCG/ACT nasal spray Commonly known as:  FLONASE Place 1 spray into both nostrils as needed (patient will take one to two sprays each nostril depending upon the weather).   formoterol 20 MCG/2ML nebulizer solution Commonly known as:  PERFOROMIST Take 2 mLs (20 mcg total) by nebulization 2 (two) times daily.   levothyroxine 100 MCG tablet Commonly known as:  SYNTHROID, LEVOTHROID TK 1 T PO D   loratadine 10 MG tablet Commonly known as:  CLARITIN Take 10 mg by mouth daily.   multivitamin tablet Take 1 tablet daily by mouth.   pravastatin 40 MG tablet Commonly known as:  PRAVACHOL   warfarin 10 MG tablet Commonly known as:  COUMADIN Take 10 mg by mouth. Patient takes warfarin 10mg  3 days per week and on other days 5mg        Allergies  Allergen Reactions  . Augmentin [Amoxicillin-Pot Clavulanate] Nausea Only  . Spiriva Handihaler [Tiotropium Bromide Monohydrate] Other (See Comments)    Congestion worse   Review of  systems: Review of systems negative except as noted in HPI / PMHx or noted below: Constitutional: Negative.  HENT: Negative.   Eyes: Negative.  Respiratory: Negative.   Cardiovascular: Negative.  Gastrointestinal: Negative.  Genitourinary: Negative.  Musculoskeletal: Negative.  Neurological: Negative.  Endo/Heme/Allergies: Negative.  Cutaneous: Negative.  Past Medical History:  Diagnosis Date  . 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 Asthma Mother   . Allergic rhinitis Neg Hx   . Angioedema Neg Hx   . Atopy Neg Hx   . Eczema Neg Hx   . Immunodeficiency Neg Hx   . Urticaria Neg Hx     Social History   Socioeconomic History  . Marital status: Married    Spouse name: Not on file  . Number of children: Not on file  . Years of education: Not on file  . Highest education level: Not on file  Social Needs  . Financial resource strain: Not on file  . Food insecurity - worry: Not on file  . Food insecurity - inability: Not on file  . Transportation needs - medical: Not on file  .  Transportation needs - non-medical: Not on file  Occupational History  . Not on file  Tobacco Use  . Smoking status: Never Smoker  . Smokeless tobacco: Never Used  Substance and Sexual Activity  . Alcohol use: No    Alcohol/week: 0.0 oz  . Drug use: No  . Sexual activity: Not on file  Other Topics Concern  . Not on file  Social History Narrative  . Not on file    I appreciate the opportunity to take part in Avrian's care. Please do not hesitate to contact me with questions.  Sincerely,   R. Jorene Guestarter Conception Doebler, MD

## 2017-05-11 NOTE — Assessment & Plan Note (Signed)
Stable.  Continue omalizumab injections, budesonide 0.5 g twice a day via nebulizer, formoterol 20 g twice a day via nebulizer, and albuterol every 4-6 hours as needed as well as 15 minutes prior to exertion.  Subjective and objective measures of pulmonary function will be followed and the treatment plan will be adjusted accordingly.

## 2017-05-11 NOTE — Assessment & Plan Note (Signed)
Currently with suboptimal control.  Continue appropriate allergen avoidance measures and fluticasone nasal spray 2 sprays per nostril daily as needed.  Nasal saline lavage (NeilMed) has been recommended as needed and prior to medicated nasal sprays along with instructions for proper administration.  For thick post nasal drainage, add guaifenesin [phone] mg (Mucinex)  twice daily as needed with adequate hydration as discussed.

## 2017-05-16 DIAGNOSIS — J4540 Moderate persistent asthma, uncomplicated: Secondary | ICD-10-CM | POA: Diagnosis not present

## 2017-05-17 ENCOUNTER — Ambulatory Visit (INDEPENDENT_AMBULATORY_CARE_PROVIDER_SITE_OTHER): Payer: Medicare Other

## 2017-05-17 DIAGNOSIS — J4540 Moderate persistent asthma, uncomplicated: Secondary | ICD-10-CM

## 2017-05-30 DIAGNOSIS — J4540 Moderate persistent asthma, uncomplicated: Secondary | ICD-10-CM | POA: Diagnosis not present

## 2017-05-31 ENCOUNTER — Ambulatory Visit (INDEPENDENT_AMBULATORY_CARE_PROVIDER_SITE_OTHER): Payer: Medicare Other

## 2017-05-31 DIAGNOSIS — J4540 Moderate persistent asthma, uncomplicated: Secondary | ICD-10-CM

## 2017-06-13 DIAGNOSIS — J4540 Moderate persistent asthma, uncomplicated: Secondary | ICD-10-CM | POA: Diagnosis not present

## 2017-06-14 ENCOUNTER — Ambulatory Visit (INDEPENDENT_AMBULATORY_CARE_PROVIDER_SITE_OTHER): Payer: Medicare Other

## 2017-06-14 DIAGNOSIS — J4540 Moderate persistent asthma, uncomplicated: Secondary | ICD-10-CM | POA: Diagnosis not present

## 2017-06-29 ENCOUNTER — Ambulatory Visit: Payer: Self-pay

## 2017-07-03 ENCOUNTER — Other Ambulatory Visit: Payer: Self-pay | Admitting: Allergy

## 2017-07-03 DIAGNOSIS — J4540 Moderate persistent asthma, uncomplicated: Secondary | ICD-10-CM

## 2017-07-03 MED ORDER — FORMOTEROL FUMARATE 20 MCG/2ML IN NEBU: 20.0000 ug | INHALATION_SOLUTION | Freq: Two times a day (BID) | RESPIRATORY_TRACT | 3 refills | Status: DC

## 2017-07-05 ENCOUNTER — Ambulatory Visit (INDEPENDENT_AMBULATORY_CARE_PROVIDER_SITE_OTHER): Payer: Medicare Other | Admitting: *Deleted

## 2017-07-05 DIAGNOSIS — J4540 Moderate persistent asthma, uncomplicated: Secondary | ICD-10-CM

## 2017-07-18 DIAGNOSIS — J4540 Moderate persistent asthma, uncomplicated: Secondary | ICD-10-CM

## 2017-07-19 ENCOUNTER — Ambulatory Visit (INDEPENDENT_AMBULATORY_CARE_PROVIDER_SITE_OTHER): Payer: Medicare Other

## 2017-07-19 DIAGNOSIS — J4540 Moderate persistent asthma, uncomplicated: Secondary | ICD-10-CM | POA: Diagnosis not present

## 2017-08-01 DIAGNOSIS — J4540 Moderate persistent asthma, uncomplicated: Secondary | ICD-10-CM

## 2017-08-02 ENCOUNTER — Ambulatory Visit (INDEPENDENT_AMBULATORY_CARE_PROVIDER_SITE_OTHER): Payer: Medicare Other

## 2017-08-02 DIAGNOSIS — J4540 Moderate persistent asthma, uncomplicated: Secondary | ICD-10-CM

## 2017-08-15 DIAGNOSIS — J4540 Moderate persistent asthma, uncomplicated: Secondary | ICD-10-CM | POA: Diagnosis not present

## 2017-08-16 ENCOUNTER — Ambulatory Visit (INDEPENDENT_AMBULATORY_CARE_PROVIDER_SITE_OTHER): Payer: Medicare Other

## 2017-08-16 DIAGNOSIS — J4540 Moderate persistent asthma, uncomplicated: Secondary | ICD-10-CM | POA: Diagnosis not present

## 2017-08-29 DIAGNOSIS — J4540 Moderate persistent asthma, uncomplicated: Secondary | ICD-10-CM

## 2017-08-30 ENCOUNTER — Ambulatory Visit (INDEPENDENT_AMBULATORY_CARE_PROVIDER_SITE_OTHER): Payer: Medicare Other

## 2017-08-30 DIAGNOSIS — J4540 Moderate persistent asthma, uncomplicated: Secondary | ICD-10-CM

## 2017-09-01 ENCOUNTER — Ambulatory Visit: Payer: Self-pay

## 2017-09-01 ENCOUNTER — Other Ambulatory Visit: Payer: Self-pay

## 2017-09-01 MED ORDER — ALBUTEROL SULFATE HFA 108 (90 BASE) MCG/ACT IN AERS: 2.0000 | INHALATION_SPRAY | RESPIRATORY_TRACT | 1 refills | Status: DC | PRN

## 2017-09-04 ENCOUNTER — Other Ambulatory Visit: Payer: Self-pay | Admitting: Allergy

## 2017-09-04 ENCOUNTER — Telehealth: Payer: Self-pay | Admitting: Allergy

## 2017-09-04 NOTE — Telephone Encounter (Signed)
Please write a letter/prior authorization for Ventolin as he has tried and failed ProAir. Thanks.

## 2017-09-04 NOTE — Telephone Encounter (Signed)
Dr. Nunzio CobbsBobbitt, Mellissa KohutJames  Called and said he went to get his inhaler and they had given him Pro-Air instead of Ventolin. Druggist said his insurance would not pay for Ventolin. He said you did letter or something to get insurance to pay last time. He said he could not use nothing but Ventolin.Thank you.

## 2017-09-05 ENCOUNTER — Other Ambulatory Visit: Payer: Self-pay | Admitting: Allergy

## 2017-09-05 MED ORDER — ALBUTEROL SULFATE HFA 108 (90 BASE) MCG/ACT IN AERS: 2.0000 | INHALATION_SPRAY | RESPIRATORY_TRACT | 5 refills | Status: DC | PRN

## 2017-09-07 NOTE — Telephone Encounter (Signed)
Called patients medicine insurance coverage [facility]([employer]).  Approval for Ventolin HFA x 12 months.  Patient informed and faxed to pt pharmacy.

## 2017-09-12 DIAGNOSIS — J4540 Moderate persistent asthma, uncomplicated: Secondary | ICD-10-CM

## 2017-09-13 ENCOUNTER — Ambulatory Visit: Payer: Self-pay

## 2017-09-13 ENCOUNTER — Ambulatory Visit (INDEPENDENT_AMBULATORY_CARE_PROVIDER_SITE_OTHER): Payer: Medicare Other

## 2017-09-13 DIAGNOSIS — J4540 Moderate persistent asthma, uncomplicated: Secondary | ICD-10-CM

## 2017-09-18 ENCOUNTER — Other Ambulatory Visit: Payer: Self-pay | Admitting: Allergy

## 2017-09-18 MED ORDER — BUDESONIDE 0.5 MG/2ML IN SUSP: RESPIRATORY_TRACT | 0 refills | Status: DC

## 2017-09-26 DIAGNOSIS — J4540 Moderate persistent asthma, uncomplicated: Secondary | ICD-10-CM | POA: Diagnosis not present

## 2017-09-27 ENCOUNTER — Ambulatory Visit (INDEPENDENT_AMBULATORY_CARE_PROVIDER_SITE_OTHER): Payer: Medicare Other | Admitting: *Deleted

## 2017-09-27 DIAGNOSIS — J4540 Moderate persistent asthma, uncomplicated: Secondary | ICD-10-CM

## 2017-10-10 DIAGNOSIS — J4540 Moderate persistent asthma, uncomplicated: Secondary | ICD-10-CM

## 2017-10-11 ENCOUNTER — Ambulatory Visit (INDEPENDENT_AMBULATORY_CARE_PROVIDER_SITE_OTHER): Payer: Medicare Other | Admitting: *Deleted

## 2017-10-11 DIAGNOSIS — J4540 Moderate persistent asthma, uncomplicated: Secondary | ICD-10-CM

## 2017-10-23 ENCOUNTER — Other Ambulatory Visit: Payer: Self-pay | Admitting: Allergy

## 2017-10-24 DIAGNOSIS — J4540 Moderate persistent asthma, uncomplicated: Secondary | ICD-10-CM | POA: Diagnosis not present

## 2017-10-25 ENCOUNTER — Ambulatory Visit (INDEPENDENT_AMBULATORY_CARE_PROVIDER_SITE_OTHER): Payer: Medicare Other

## 2017-10-25 ENCOUNTER — Other Ambulatory Visit: Payer: Self-pay | Admitting: Allergy

## 2017-10-25 DIAGNOSIS — J4540 Moderate persistent asthma, uncomplicated: Secondary | ICD-10-CM | POA: Diagnosis not present

## 2017-10-25 MED ORDER — BUDESONIDE 0.5 MG/2ML IN SUSP: RESPIRATORY_TRACT | 3 refills | Status: DC

## 2017-11-03 DIAGNOSIS — J4540 Moderate persistent asthma, uncomplicated: Secondary | ICD-10-CM | POA: Diagnosis not present

## 2017-11-06 ENCOUNTER — Ambulatory Visit (INDEPENDENT_AMBULATORY_CARE_PROVIDER_SITE_OTHER): Payer: Medicare Other

## 2017-11-06 ENCOUNTER — Ambulatory Visit (INDEPENDENT_AMBULATORY_CARE_PROVIDER_SITE_OTHER): Payer: Medicare Other | Admitting: Family Medicine

## 2017-11-06 ENCOUNTER — Encounter: Payer: Self-pay | Admitting: Family Medicine

## 2017-11-06 VITALS — BP 90/70 | HR 69 | Temp 97.9°F | Resp 12

## 2017-11-06 DIAGNOSIS — J4540 Moderate persistent asthma, uncomplicated: Secondary | ICD-10-CM

## 2017-11-06 DIAGNOSIS — J3089 Other allergic rhinitis: Secondary | ICD-10-CM

## 2017-11-06 DIAGNOSIS — J4550 Severe persistent asthma, uncomplicated: Secondary | ICD-10-CM | POA: Diagnosis not present

## 2017-11-06 NOTE — Patient Instructions (Addendum)
  Continue omalizumab injections every 2 weeks Continue budesonide 0.5 g twice a day via nebulizer to prevent cough, wheeze, and shortness of breath Continue formoterol 20 g twice a day via nebulizer to prevent cough, wheeze, and shortness of breath Continue albuterol every 4 hours as needed for cough or wheeze as well as 15 minutes prior to exertion. Continue appropriate allergen avoidance measures Continue fluticasone nasal spray 2 sprays per nostril daily as needed for a stuffy nose Consider nasal saline lavage (NeilMed) before medicated nasal saline sprays For thick post nasal drainage, add guaifenesin [phone] mg (Mucinex)  twice daily as needed with adequate hydration. We will draw a CBC today. We will call you when the result is available, usually 1-2 weeks Information provided about MANDALA study  Return in about 4 months or if symptoms worsen or fail to improve.

## 2017-11-06 NOTE — Progress Notes (Signed)
[address] [place] [address] Dept: [phone]  FOLLOW UP NOTE  Patient ID: Caleb Mills, male    DOB: 10-Sep-1950  Age: 67 y.o. MRN: [id number] Date of Office Visit: 11/06/2017  Assessment  Chief Complaint: Allergic Rhinitis  and Asthma  HPI Caleb Mills is a 67 year old male who presents to the clinic for a follow up visit. He was last seen in the clinic on 05/11/2017 by Dr. Nunzio Cobbs for evaluation of severe persistent asthma ,on Xolair, and allergic rhinitis. At that time, his asthma was relatively stable with the use of Xolair, formoterol via nebulizer, budesonide via nebulizer, and albuterol inhaler as needed.   In the interim, in April, he has received azithromycin and two rounds of prednisone for an upper respiratory infection with resolution of symptoms.   At today's visit, he reports shortness of breath, wheeze, and cough which is worse with activity and high humidity. The cough is reported as dry and he is unable to cough up any sputum. He is currently using formoterol and budesonide via nebulizer twice a day, albuterol inhaler 0-12 times a day depending on humidity and activity level, and receiving Xolair injections every other week. He reports significant improvement in his asthma with the use of Xolair.  Allergic rhinitis is reported as well controlled with Flonase 1 spray in each nostril once a day and Claritin 10 mg once a day. He is also using nasal saline rinses as needed.   His current medications are listed in the chart.   Drug Allergies:  Allergies  Allergen Reactions  . Augmentin [Amoxicillin-Pot Clavulanate] Nausea Only  . Ciprofloxacin Other (See Comments)    Joint pain  . Spiriva Handihaler [Tiotropium Bromide Monohydrate] Other (See Comments)    Congestion worse    Physical Exam: BP 90/70 (BP Location: Left Arm, Patient Position: Sitting, Cuff Size: Normal)   Pulse 69   Temp 97.9 F (36.6 C) (Oral)   Resp 12   SpO2 95%    Physical Exam    Constitutional: He is oriented to person, place, and time. He appears well-developed and well-nourished.  HENT:  Head: Normocephalic.  Right Ear: External ear normal.  Left Ear: External ear normal.  Bilateral nares slightly erythematous and edematous with clear nasal drainage noted. Pharynx slightly erythematous. Ears normal. Eyes normal.  Eyes: Conjunctivae are normal.  Neck: Normal range of motion. Neck supple.  Cardiovascular: Normal rate, regular rhythm and normal heart sounds.  No murmur noted  Pulmonary/Chest: Effort normal and breath sounds normal.  Slight expiratory wheeze noted bilateral lungs.  Musculoskeletal: Normal range of motion.  Neurological: He is alert and oriented to person, place, and time.  Skin: Skin is warm and dry.    Diagnostics: FVC 2.72, FEV1 0.96. Predicted FVC 3.68, predicted FEV1 2.71. Spirometry indicates mild restriction and severe obstruction.  Assessment and Plan: 1. Severe persistent asthma without complication   2. Allergic rhinitis        Patient Instructions   Continue omalizumab injections every 2 weeks Continue budesonide 0.5 g twice a day via nebulizer to prevent cough, wheeze, and shortness of breath Continue formoterol 20 g twice a day via nebulizer to prevent cough, wheeze, and shortness of breath Continue albuterol every 4 hours as needed for cough or wheeze as well as 15 minutes prior to exertion. Continue appropriate allergen avoidance measures Continue fluticasone nasal spray 2 sprays per nostril daily as needed for a stuffy nose Consider nasal saline lavage (NeilMed) before medicated nasal saline sprays  For thick post nasal drainage, add guaifenesin [phone] mg (Mucinex)  twice daily as needed with adequate hydration. We will draw a CBC today. We will call you when the result is available, usually 1-2 weeks Information provided about MANDALA study  Return in about 4 months or if symptoms worsen or fail to  improve.  Return in about 4 months (around 03/09/2018), or if symptoms worsen or fail to improve.   Thank you for the opportunity to care for this patient.  Please do not hesitate to contact me with questions.  Thermon Leyland, FNP [facility]  I have provided oversight concerning Thermon Leyland' evaluation and treatment of this patient's health issues addressed during today's encounter. I agree with the assessment and therapeutic plan as outlined in the note.   Thank you for the opportunity to care for this patient.  Please do not hesitate to contact me with questions.  Tonette Bihari, M.D.  [facility] [address] [place], [place] [address] [phone]

## 2017-11-08 ENCOUNTER — Ambulatory Visit: Payer: Medicare Other | Admitting: Allergy and Immunology

## 2017-11-08 ENCOUNTER — Ambulatory Visit: Payer: Medicare Other

## 2017-11-14 LAB — CBC WITH DIFFERENTIAL/PLATELET
BASOS: 1 %
Basophils Absolute: 0 10*3/uL (ref 0.0–0.2)
EOS (ABSOLUTE): 0.3 10*3/uL (ref 0.0–0.4)
Eos: 5 %
HEMOGLOBIN: 13.7 g/dL (ref 13.0–17.7)
Hematocrit: 42.2 % (ref 37.5–51.0)
IMMATURE GRANS (ABS): 0 10*3/uL (ref 0.0–0.1)
Immature Granulocytes: 0 %
LYMPHS ABS: 0.7 10*3/uL (ref 0.7–3.1)
LYMPHS: 11 %
MCH: 30 pg (ref 26.6–33.0)
MCHC: 32.5 g/dL (ref 31.5–35.7)
MCV: 92 fL (ref 79–97)
MONOCYTES: 12 %
Monocytes Absolute: 0.8 10*3/uL (ref 0.1–0.9)
NEUTROS ABS: 4.7 10*3/uL (ref 1.4–7.0)
Neutrophils: 71 %
Platelets: 247 10*3/uL (ref 150–379)
RBC: 4.57 x10E6/uL (ref 4.14–5.80)
RDW: 14.4 % (ref 12.3–15.4)
WBC: 6.6 10*3/uL (ref 3.4–10.8)

## 2017-11-20 DIAGNOSIS — J4540 Moderate persistent asthma, uncomplicated: Secondary | ICD-10-CM

## 2017-11-21 ENCOUNTER — Other Ambulatory Visit: Payer: Self-pay

## 2017-11-21 ENCOUNTER — Ambulatory Visit (INDEPENDENT_AMBULATORY_CARE_PROVIDER_SITE_OTHER): Payer: Medicare Other

## 2017-11-21 DIAGNOSIS — J4540 Moderate persistent asthma, uncomplicated: Secondary | ICD-10-CM

## 2017-11-21 MED ORDER — FORMOTEROL FUMARATE 20 MCG/2ML IN NEBU: 20.0000 ug | INHALATION_SOLUTION | Freq: Two times a day (BID) | RESPIRATORY_TRACT | 3 refills | Status: DC

## 2017-11-21 NOTE — Telephone Encounter (Signed)
Refill for perforomist 20 mcg x 1 with 5 refills at [facility]

## 2017-11-28 ENCOUNTER — Other Ambulatory Visit: Payer: Self-pay

## 2017-11-28 DIAGNOSIS — J4550 Severe persistent asthma, uncomplicated: Secondary | ICD-10-CM

## 2017-11-28 MED ORDER — ALBUTEROL SULFATE (2.5 MG/3ML) 0.083% IN NEBU: INHALATION_SOLUTION | RESPIRATORY_TRACT | 3 refills | Status: DC

## 2017-12-04 DIAGNOSIS — J4540 Moderate persistent asthma, uncomplicated: Secondary | ICD-10-CM

## 2017-12-05 ENCOUNTER — Ambulatory Visit (INDEPENDENT_AMBULATORY_CARE_PROVIDER_SITE_OTHER): Payer: Medicare Other

## 2017-12-05 DIAGNOSIS — J4540 Moderate persistent asthma, uncomplicated: Secondary | ICD-10-CM | POA: Diagnosis not present

## 2017-12-21 DIAGNOSIS — J4540 Moderate persistent asthma, uncomplicated: Secondary | ICD-10-CM | POA: Diagnosis not present

## 2017-12-22 ENCOUNTER — Ambulatory Visit (INDEPENDENT_AMBULATORY_CARE_PROVIDER_SITE_OTHER): Payer: Medicare Other

## 2017-12-22 DIAGNOSIS — J4540 Moderate persistent asthma, uncomplicated: Secondary | ICD-10-CM | POA: Diagnosis not present

## 2018-01-05 DIAGNOSIS — J4540 Moderate persistent asthma, uncomplicated: Secondary | ICD-10-CM | POA: Diagnosis not present

## 2018-01-08 ENCOUNTER — Ambulatory Visit (INDEPENDENT_AMBULATORY_CARE_PROVIDER_SITE_OTHER): Payer: Medicare Other

## 2018-01-08 ENCOUNTER — Telehealth: Payer: Self-pay | Admitting: Allergy

## 2018-01-08 DIAGNOSIS — J4540 Moderate persistent asthma, uncomplicated: Secondary | ICD-10-CM

## 2018-01-08 NOTE — Telephone Encounter (Signed)
Dr. Beaulah DinningBardelas ,Katheran AweJames Thelen came in to get injections this morning. Wanted a hand written prescription for nebulizer and also a portable one also. Thanks

## 2018-01-08 NOTE — Telephone Encounter (Signed)
Called patient and informed him prescriptions were ready.

## 2018-01-08 NOTE — Telephone Encounter (Signed)
Will do!

## 2018-01-19 DIAGNOSIS — J4540 Moderate persistent asthma, uncomplicated: Secondary | ICD-10-CM

## 2018-01-22 ENCOUNTER — Ambulatory Visit (INDEPENDENT_AMBULATORY_CARE_PROVIDER_SITE_OTHER): Payer: Medicare Other

## 2018-01-22 DIAGNOSIS — J4540 Moderate persistent asthma, uncomplicated: Secondary | ICD-10-CM | POA: Diagnosis not present

## 2018-02-02 DIAGNOSIS — J4550 Severe persistent asthma, uncomplicated: Secondary | ICD-10-CM

## 2018-02-05 ENCOUNTER — Ambulatory Visit (INDEPENDENT_AMBULATORY_CARE_PROVIDER_SITE_OTHER): Payer: Medicare Other | Admitting: *Deleted

## 2018-02-05 DIAGNOSIS — J4550 Severe persistent asthma, uncomplicated: Secondary | ICD-10-CM | POA: Diagnosis not present

## 2018-02-16 DIAGNOSIS — J4540 Moderate persistent asthma, uncomplicated: Secondary | ICD-10-CM | POA: Diagnosis not present

## 2018-02-19 ENCOUNTER — Ambulatory Visit (INDEPENDENT_AMBULATORY_CARE_PROVIDER_SITE_OTHER): Payer: Medicare Other | Admitting: *Deleted

## 2018-02-19 DIAGNOSIS — J4540 Moderate persistent asthma, uncomplicated: Secondary | ICD-10-CM

## 2018-02-22 ENCOUNTER — Ambulatory Visit: Payer: Self-pay

## 2018-03-06 DIAGNOSIS — J4540 Moderate persistent asthma, uncomplicated: Secondary | ICD-10-CM | POA: Diagnosis not present

## 2018-03-07 ENCOUNTER — Ambulatory Visit (INDEPENDENT_AMBULATORY_CARE_PROVIDER_SITE_OTHER): Payer: Medicare Other

## 2018-03-07 DIAGNOSIS — J4540 Moderate persistent asthma, uncomplicated: Secondary | ICD-10-CM | POA: Diagnosis not present

## 2018-03-20 DIAGNOSIS — J4540 Moderate persistent asthma, uncomplicated: Secondary | ICD-10-CM

## 2018-03-21 ENCOUNTER — Ambulatory Visit (INDEPENDENT_AMBULATORY_CARE_PROVIDER_SITE_OTHER): Payer: Medicare Other

## 2018-03-21 DIAGNOSIS — J4540 Moderate persistent asthma, uncomplicated: Secondary | ICD-10-CM

## 2018-03-28 ENCOUNTER — Other Ambulatory Visit: Payer: Self-pay | Admitting: Allergy

## 2018-03-28 MED ORDER — ALBUTEROL SULFATE (2.5 MG/3ML) 0.083% IN NEBU: 2.5000 mg | INHALATION_SOLUTION | RESPIRATORY_TRACT | 1 refills | Status: DC | PRN

## 2018-04-03 DIAGNOSIS — J4540 Moderate persistent asthma, uncomplicated: Secondary | ICD-10-CM | POA: Diagnosis not present

## 2018-04-04 ENCOUNTER — Ambulatory Visit (INDEPENDENT_AMBULATORY_CARE_PROVIDER_SITE_OTHER): Payer: Medicare Other | Admitting: *Deleted

## 2018-04-04 DIAGNOSIS — J4540 Moderate persistent asthma, uncomplicated: Secondary | ICD-10-CM | POA: Diagnosis not present

## 2018-04-06 ENCOUNTER — Other Ambulatory Visit: Payer: Self-pay | Admitting: *Deleted

## 2018-04-06 MED ORDER — FORMOTEROL FUMARATE 20 MCG/2ML IN NEBU: 20.0000 ug | INHALATION_SOLUTION | Freq: Two times a day (BID) | RESPIRATORY_TRACT | 0 refills | Status: DC

## 2018-04-09 ENCOUNTER — Telehealth: Payer: Self-pay | Admitting: Allergy

## 2018-04-09 ENCOUNTER — Other Ambulatory Visit: Payer: Self-pay | Admitting: Allergy

## 2018-04-09 NOTE — Telephone Encounter (Signed)
Perforomist 20MCG/2LML approved under Part A/B medicare

## 2018-04-17 DIAGNOSIS — J4540 Moderate persistent asthma, uncomplicated: Secondary | ICD-10-CM | POA: Diagnosis not present

## 2018-04-18 ENCOUNTER — Ambulatory Visit (INDEPENDENT_AMBULATORY_CARE_PROVIDER_SITE_OTHER): Payer: Medicare Other | Admitting: *Deleted

## 2018-04-18 DIAGNOSIS — J4540 Moderate persistent asthma, uncomplicated: Secondary | ICD-10-CM

## 2018-05-01 DIAGNOSIS — J4540 Moderate persistent asthma, uncomplicated: Secondary | ICD-10-CM

## 2018-05-02 ENCOUNTER — Ambulatory Visit (INDEPENDENT_AMBULATORY_CARE_PROVIDER_SITE_OTHER): Payer: Medicare Other

## 2018-05-02 ENCOUNTER — Other Ambulatory Visit: Payer: Self-pay | Admitting: Allergy

## 2018-05-02 DIAGNOSIS — J4540 Moderate persistent asthma, uncomplicated: Secondary | ICD-10-CM

## 2018-05-02 MED ORDER — ALBUTEROL SULFATE (2.5 MG/3ML) 0.083% IN NEBU: 2.5000 mg | INHALATION_SOLUTION | RESPIRATORY_TRACT | 1 refills | Status: DC | PRN

## 2018-05-09 ENCOUNTER — Other Ambulatory Visit: Payer: Self-pay | Admitting: Allergy and Immunology

## 2018-05-14 ENCOUNTER — Telehealth: Payer: Self-pay | Admitting: Allergy

## 2018-05-14 NOTE — Telephone Encounter (Signed)
Talked with pharmacist and they said the  Perforomist was filled on the 9th of November.

## 2018-05-15 DIAGNOSIS — J4540 Moderate persistent asthma, uncomplicated: Secondary | ICD-10-CM | POA: Diagnosis not present

## 2018-05-15 NOTE — Telephone Encounter (Signed)
na

## 2018-05-16 ENCOUNTER — Ambulatory Visit (INDEPENDENT_AMBULATORY_CARE_PROVIDER_SITE_OTHER): Payer: Medicare Other | Admitting: *Deleted

## 2018-05-16 DIAGNOSIS — J4540 Moderate persistent asthma, uncomplicated: Secondary | ICD-10-CM | POA: Diagnosis not present

## 2018-05-28 DIAGNOSIS — J4540 Moderate persistent asthma, uncomplicated: Secondary | ICD-10-CM

## 2018-05-29 ENCOUNTER — Ambulatory Visit (INDEPENDENT_AMBULATORY_CARE_PROVIDER_SITE_OTHER): Payer: Medicare Other

## 2018-05-29 DIAGNOSIS — J4540 Moderate persistent asthma, uncomplicated: Secondary | ICD-10-CM | POA: Diagnosis not present

## 2018-05-30 ENCOUNTER — Ambulatory Visit: Payer: Self-pay

## 2018-06-06 ENCOUNTER — Other Ambulatory Visit: Payer: Self-pay | Admitting: Allergy and Immunology

## 2018-06-06 NOTE — Telephone Encounter (Signed)
Refill albuterol 0.083% x one.  Patient needs 6 month OV.

## 2018-06-08 DIAGNOSIS — J4540 Moderate persistent asthma, uncomplicated: Secondary | ICD-10-CM | POA: Diagnosis not present

## 2018-06-11 ENCOUNTER — Other Ambulatory Visit: Payer: Self-pay | Admitting: Allergy

## 2018-06-11 ENCOUNTER — Other Ambulatory Visit: Payer: Self-pay | Admitting: Allergy and Immunology

## 2018-06-11 ENCOUNTER — Ambulatory Visit (INDEPENDENT_AMBULATORY_CARE_PROVIDER_SITE_OTHER): Payer: Medicare Other

## 2018-06-11 DIAGNOSIS — J4540 Moderate persistent asthma, uncomplicated: Secondary | ICD-10-CM | POA: Diagnosis not present

## 2018-06-11 NOTE — Telephone Encounter (Signed)
Pharmacy got Perforomist approved under Medicare part A&B

## 2018-06-22 DIAGNOSIS — J4540 Moderate persistent asthma, uncomplicated: Secondary | ICD-10-CM | POA: Diagnosis not present

## 2018-06-25 ENCOUNTER — Ambulatory Visit (INDEPENDENT_AMBULATORY_CARE_PROVIDER_SITE_OTHER): Payer: Medicare Other

## 2018-06-25 DIAGNOSIS — J4540 Moderate persistent asthma, uncomplicated: Secondary | ICD-10-CM

## 2018-06-28 ENCOUNTER — Other Ambulatory Visit: Payer: Self-pay | Admitting: Allergy and Immunology

## 2018-07-05 ENCOUNTER — Other Ambulatory Visit: Payer: Self-pay

## 2018-07-05 MED ORDER — ALBUTEROL SULFATE (2.5 MG/3ML) 0.083% IN NEBU: INHALATION_SOLUTION | RESPIRATORY_TRACT | 1 refills | Status: DC

## 2018-07-06 DIAGNOSIS — J4540 Moderate persistent asthma, uncomplicated: Secondary | ICD-10-CM | POA: Diagnosis not present

## 2018-07-09 ENCOUNTER — Ambulatory Visit (INDEPENDENT_AMBULATORY_CARE_PROVIDER_SITE_OTHER): Payer: Medicare Other

## 2018-07-09 DIAGNOSIS — J4540 Moderate persistent asthma, uncomplicated: Secondary | ICD-10-CM

## 2018-07-12 ENCOUNTER — Other Ambulatory Visit: Payer: Self-pay | Admitting: Allergy and Immunology

## 2018-07-13 ENCOUNTER — Other Ambulatory Visit: Payer: Self-pay

## 2018-07-16 ENCOUNTER — Other Ambulatory Visit: Payer: Self-pay

## 2018-07-16 MED ORDER — FORMOTEROL FUMARATE 20 MCG/2ML IN NEBU: INHALATION_SOLUTION | RESPIRATORY_TRACT | 0 refills | Status: DC

## 2018-07-20 DIAGNOSIS — J4540 Moderate persistent asthma, uncomplicated: Secondary | ICD-10-CM | POA: Diagnosis not present

## 2018-07-23 ENCOUNTER — Other Ambulatory Visit: Payer: Self-pay | Admitting: Allergy

## 2018-07-23 ENCOUNTER — Ambulatory Visit (INDEPENDENT_AMBULATORY_CARE_PROVIDER_SITE_OTHER): Payer: Medicare Other

## 2018-07-23 DIAGNOSIS — J4540 Moderate persistent asthma, uncomplicated: Secondary | ICD-10-CM | POA: Diagnosis not present

## 2018-07-23 MED ORDER — ALBUTEROL SULFATE (2.5 MG/3ML) 0.083% IN NEBU: INHALATION_SOLUTION | RESPIRATORY_TRACT | 1 refills | Status: DC

## 2018-08-06 DIAGNOSIS — J4540 Moderate persistent asthma, uncomplicated: Secondary | ICD-10-CM | POA: Diagnosis not present

## 2018-08-07 ENCOUNTER — Ambulatory Visit (INDEPENDENT_AMBULATORY_CARE_PROVIDER_SITE_OTHER): Payer: Medicare Other

## 2018-08-07 DIAGNOSIS — J4540 Moderate persistent asthma, uncomplicated: Secondary | ICD-10-CM

## 2018-08-14 ENCOUNTER — Other Ambulatory Visit: Payer: Self-pay | Admitting: Allergy

## 2018-08-14 NOTE — Telephone Encounter (Signed)
Ventolin approved [id number]

## 2018-08-15 ENCOUNTER — Other Ambulatory Visit: Payer: Self-pay | Admitting: Allergy

## 2018-08-15 NOTE — Telephone Encounter (Signed)
Ventolin approved

## 2018-08-20 DIAGNOSIS — J4540 Moderate persistent asthma, uncomplicated: Secondary | ICD-10-CM

## 2018-08-21 ENCOUNTER — Ambulatory Visit (INDEPENDENT_AMBULATORY_CARE_PROVIDER_SITE_OTHER): Payer: Medicare Other

## 2018-08-21 DIAGNOSIS — J4540 Moderate persistent asthma, uncomplicated: Secondary | ICD-10-CM

## 2018-08-27 ENCOUNTER — Other Ambulatory Visit: Payer: Self-pay | Admitting: Allergy

## 2018-08-27 MED ORDER — ALBUTEROL SULFATE (2.5 MG/3ML) 0.083% IN NEBU: INHALATION_SOLUTION | RESPIRATORY_TRACT | 1 refills | Status: DC

## 2018-09-04 ENCOUNTER — Ambulatory Visit: Payer: Self-pay

## 2018-09-04 ENCOUNTER — Other Ambulatory Visit: Payer: Self-pay | Admitting: Allergy and Immunology

## 2018-09-11 ENCOUNTER — Ambulatory Visit (INDEPENDENT_AMBULATORY_CARE_PROVIDER_SITE_OTHER): Payer: Medicare Other | Admitting: Pediatrics

## 2018-09-11 ENCOUNTER — Encounter: Payer: Self-pay | Admitting: Pediatrics

## 2018-09-11 VITALS — BP 128/78 | HR 69 | Temp 98.6°F | Resp 20

## 2018-09-11 DIAGNOSIS — Z952 Presence of prosthetic heart valve: Secondary | ICD-10-CM

## 2018-09-11 DIAGNOSIS — J3089 Other allergic rhinitis: Secondary | ICD-10-CM

## 2018-09-11 DIAGNOSIS — J4551 Severe persistent asthma with (acute) exacerbation: Secondary | ICD-10-CM

## 2018-09-11 MED ORDER — ALBUTEROL SULFATE (2.5 MG/3ML) 0.083% IN NEBU: INHALATION_SOLUTION | RESPIRATORY_TRACT | 1 refills | Status: DC

## 2018-09-11 MED ORDER — FORMOTEROL FUMARATE 20 MCG/2ML IN NEBU: INHALATION_SOLUTION | RESPIRATORY_TRACT | 5 refills | Status: DC

## 2018-09-11 NOTE — Patient Instructions (Addendum)
Formoterol 20 mcg twice a day by nebulization 10 minutes later use budesonide 0.5 mg twice a day by nebulization Ventolin 2 puffs every 4 hours if needed for wheezing or coughing spells or instead albuterol 0.083% 1 unit dose every 4 hours if needed Claritin 10 mg-take 1 tablet once a day if needed for runny nose Fluticasone 1 spray per nostril twice a day if needed for stuffy nose Add prednisone 20 mg twice a day for 3 days, 20 mg on the fourth day, 10 mg on the fifth day He has had a total eosinophil count of 400 so he would be eligible for Harrington Challenger He wants to go ahead and apply for the use of Fasenra due to the severity of his severe persistent asthma Continue on Xolair every 2 weeks Continue on his other medications

## 2018-09-11 NOTE — Progress Notes (Signed)
[address] [place] [address] Dept: [phone]  FOLLOW UP NOTE  Patient ID: Caleb Mills, male    DOB: 27-Mar-1951  Age: 68 y.o. MRN: [id number] Date of Office Visit: 09/11/2018  Assessment  Chief Complaint: Asthma  HPI Caleb Mills presents for evaluation of coughing wheezing and shortness of breath.  He is on Xolair 225 mg every 14 days he has been having more breathing difficulties over the past 2 weeks.  He is some formoterol 20 mcg twice a day followed by budesonide 0.5 mg twice a day.  He has severe persistent asthma he has failed all currently available preventative inhalers for asthma.  Last fall he did have a total eosinophil count of 400 which would make him eligible for Fasenra   Drug Allergies:  Allergies  Allergen Reactions  . Augmentin [Amoxicillin-Pot Clavulanate] Nausea Only  . Ciprofloxacin Other (See Comments)    Joint pain  . Spiriva Handihaler [Tiotropium Bromide Monohydrate] Other (See Comments)    Congestion worse  . Tiotropium Other (See Comments)    Congestion worse    Physical Exam: BP 128/78   Pulse 69   Temp 98.6 F (37 C) (Oral)   Resp 20   SpO2 98%    Physical Exam Constitutional:      Appearance: Normal appearance. He is normal weight.  HENT:     Head:     Comments: Eyes normal.  Ears normal.  Nose normal.  Pharynx normal. Neck:     Musculoskeletal: Neck supple.  Cardiovascular:     Comments: S1-S2 normal no murmurs Pulmonary:     Comments: Decreased breath sounds at both bases.  No rales or rhonchi Lymphadenopathy:     Cervical: No cervical adenopathy.  Neurological:     General: No focal deficit present.     Mental Status: He is alert and oriented to person, place, and time.  Psychiatric:        Mood and Affect: Mood normal.        Behavior: Behavior normal.        Thought Content: Thought content normal.        Judgment: Judgment normal.     Diagnostics: FVC 1.69 L FEV1 0.69 L.  Predicted FVC 3.65 L predicted FEV1  2.67 L.  After a DuoNeb FVC 2.39 L FEV1 0.90 L-this shows a severe reduction in the forced vital capacity and FEV1 with significant improvement after a DuoNeb  Assessment and Plan: 1. Severe persistent asthma with acute exacerbation   2. History of aortic valve replacement   3. Other allergic rhinitis     Meds ordered this encounter  Medications  . formoterol (PERFOROMIST) 20 MCG/2ML nebulizer solution    Sig: Use twice a day by nebulization    Dispense:  120 mL    Refill:  5  . albuterol (PROVENTIL) (2.5 MG/3ML) 0.083% nebulizer solution    Sig: 1 unit dose in nebulizer every 4 hours if needed    Dispense:  75 mL    Refill:  1    Please dispense 75 ml vials    Patient Instructions  Formoterol 20 mcg twice a day by nebulization 10 minutes later use budesonide 0.5 mg twice a day by nebulization Ventolin 2 puffs every 4 hours if needed for wheezing or coughing spells or instead albuterol 0.083% 1 unit dose every 4 hours if needed Claritin 10 mg-take 1 tablet once a day if needed for runny nose Fluticasone 1 spray per nostril twice a day if  needed for stuffy nose Add prednisone 20 mg twice a day for 3 days, 20 mg on the fourth day, 10 mg on the fifth day He has had a total eosinophil count of 400 so he would be eligible for Harrington Challenger He wants to go ahead and apply for the use of Fasenra due to the severity of his severe persistent asthma Continue on Xolair every 2 weeks Continue on his other medications   Return in about 6 weeks (around 10/23/2018).    Thank you for the opportunity to care for this patient.  Please do not hesitate to contact me with questions.  Tonette Bihari, M.D.  [facility] [address] [place], [place] [address] [phone]

## 2018-09-17 DIAGNOSIS — J4551 Severe persistent asthma with (acute) exacerbation: Secondary | ICD-10-CM | POA: Diagnosis not present

## 2018-09-18 ENCOUNTER — Ambulatory Visit (INDEPENDENT_AMBULATORY_CARE_PROVIDER_SITE_OTHER): Payer: Medicare Other

## 2018-09-18 ENCOUNTER — Other Ambulatory Visit: Payer: Self-pay

## 2018-09-18 DIAGNOSIS — J4551 Severe persistent asthma with (acute) exacerbation: Secondary | ICD-10-CM | POA: Diagnosis not present

## 2018-09-24 ENCOUNTER — Telehealth: Payer: Self-pay | Admitting: Pediatrics

## 2018-09-24 ENCOUNTER — Other Ambulatory Visit: Payer: Self-pay

## 2018-09-24 ENCOUNTER — Telehealth: Payer: Self-pay

## 2018-09-24 MED ORDER — FORMOTEROL FUMARATE 20 MCG/2ML IN NEBU: INHALATION_SOLUTION | RESPIRATORY_TRACT | 5 refills | Status: DC

## 2018-09-24 NOTE — Telephone Encounter (Signed)
Completed letter to his work

## 2018-09-24 NOTE — Telephone Encounter (Signed)
Per Caleb Mills we can not write notes to be excused from work because of asthma. I informed pt.

## 2018-09-24 NOTE — Telephone Encounter (Signed)
Dr. Beaulah Dinning out of office. Will route to Thermon Leyland, FNP.

## 2018-09-24 NOTE — Telephone Encounter (Signed)
Per Thurston Hole and [employer] [place] I called pt about possibly doing FMLA paperwork. Pt stated they wouldn't allow it at his work. Pt stated he will have to go back to work tomorrow and be exposed and will get the covid and will die and this would probably be the last time we would speak to him. He also stated he would try his pcp for a letter.

## 2018-09-24 NOTE — Telephone Encounter (Signed)
Patient was wondering if we could write a note to excuse him from work as he works at [employer] and is exposed to a lot of customers and with his asthma history he was concerned about getting the corona virus and it killing him. Dated for today and going forward. Please advise as to what to type.

## 2018-09-24 NOTE — Telephone Encounter (Signed)
Note written per anne fnp  Per dr bobbitt and waiting up front for pt pick up

## 2018-09-24 NOTE — Telephone Encounter (Signed)
Letter to his work completed

## 2018-09-24 NOTE — Telephone Encounter (Signed)
PT called in to speak to nurse about getting a dr note so he can be excused from work until the covid virus has died down. He is fearful of exposing himself to others & the virus with his lung condition. Please advise.

## 2018-10-01 DIAGNOSIS — J4550 Severe persistent asthma, uncomplicated: Secondary | ICD-10-CM | POA: Diagnosis not present

## 2018-10-02 ENCOUNTER — Ambulatory Visit (INDEPENDENT_AMBULATORY_CARE_PROVIDER_SITE_OTHER): Payer: Medicare Other

## 2018-10-02 ENCOUNTER — Other Ambulatory Visit: Payer: Self-pay

## 2018-10-02 ENCOUNTER — Telehealth: Payer: Self-pay | Admitting: *Deleted

## 2018-10-02 DIAGNOSIS — J4551 Severe persistent asthma with (acute) exacerbation: Secondary | ICD-10-CM

## 2018-10-02 DIAGNOSIS — J4550 Severe persistent asthma, uncomplicated: Secondary | ICD-10-CM

## 2018-10-02 DIAGNOSIS — J309 Allergic rhinitis, unspecified: Secondary | ICD-10-CM

## 2018-10-02 NOTE — Telephone Encounter (Signed)
I had left patient message a couple weeks ago to discuss possible biologic change pr Dr Beaulah Dinning.  He happen to come into the [facility] office today and ask Damita about possible change and had discussed same with Thurston Hole at visit last week. Spoke to patient advised that even though Dr Beaulah Dinning had suggested that he start Fasenra along with his Xolair due to his insistence in not stopping his Xolair in past but still uncontrolled asthma.  I did explain to him that with his medications going through buy an bill (the only affordable option) that there may be chance that [facility] could deny claims on two biologics.  I did advise him that if he wanted to try Harrington Challenger a while to see if better control his asthma would be better solution to Ins possibly not paying and if he is not satisfied with results on new drug can go back to his Xolair and he was fine with that at this time. I advised him I would let Dr Beaulah Dinning know of the plan.

## 2018-10-04 NOTE — Telephone Encounter (Signed)
Please forward to [employer] acting up. Harrington Challenger OK Make sure buy and bill will pay before ordering Thanks Dr. Leonard Schwartz

## 2018-10-05 NOTE — Telephone Encounter (Signed)
Please advise 

## 2018-10-05 NOTE — Telephone Encounter (Signed)
I have already got him set up for switch with next injection

## 2018-10-08 ENCOUNTER — Other Ambulatory Visit: Payer: Self-pay

## 2018-10-08 MED ORDER — ALBUTEROL SULFATE HFA 108 (90 BASE) MCG/ACT IN AERS: 2.0000 | INHALATION_SPRAY | RESPIRATORY_TRACT | 5 refills | Status: DC | PRN

## 2018-10-11 ENCOUNTER — Other Ambulatory Visit: Payer: Self-pay

## 2018-10-11 MED ORDER — FORMOTEROL FUMARATE 20 MCG/2ML IN NEBU: INHALATION_SOLUTION | RESPIRATORY_TRACT | 5 refills | Status: DC

## 2018-10-15 DIAGNOSIS — J4550 Severe persistent asthma, uncomplicated: Secondary | ICD-10-CM | POA: Diagnosis not present

## 2018-10-16 ENCOUNTER — Other Ambulatory Visit: Payer: Self-pay

## 2018-10-16 ENCOUNTER — Ambulatory Visit (INDEPENDENT_AMBULATORY_CARE_PROVIDER_SITE_OTHER): Payer: Medicare Other

## 2018-10-16 DIAGNOSIS — J4550 Severe persistent asthma, uncomplicated: Secondary | ICD-10-CM | POA: Diagnosis not present

## 2018-10-16 MED ORDER — BENRALIZUMAB 30 MG/ML ~~LOC~~ SOSY
30.0000 mg | PREFILLED_SYRINGE | Freq: Once | SUBCUTANEOUS | Status: AC
  Administered 2018-10-16 – 2018-12-12 (×3): 30 mg via SUBCUTANEOUS

## 2018-10-23 ENCOUNTER — Ambulatory Visit: Payer: Medicare Other | Admitting: Pediatrics

## 2018-10-23 ENCOUNTER — Other Ambulatory Visit: Payer: Self-pay

## 2018-10-29 ENCOUNTER — Other Ambulatory Visit: Payer: Self-pay | Admitting: *Deleted

## 2018-10-29 ENCOUNTER — Encounter: Payer: Self-pay | Admitting: *Deleted

## 2018-10-29 ENCOUNTER — Telehealth: Payer: Self-pay | Admitting: *Deleted

## 2018-10-29 MED ORDER — ALBUTEROL SULFATE (2.5 MG/3ML) 0.083% IN NEBU: INHALATION_SOLUTION | RESPIRATORY_TRACT | 2 refills | Status: DC

## 2018-10-29 NOTE — Telephone Encounter (Signed)
Patient was given a letter on March 23rd to be out of work due to [employer]. His work is asking for an updated letter with a current date. Patient wants to be notified when it is ready and he will pick it up.

## 2018-10-29 NOTE — Telephone Encounter (Signed)
Can you please extend the date on this letter for 2 more weeks. Thank you

## 2018-10-29 NOTE — Telephone Encounter (Signed)
Pt aware that new letter is ready for pick up today.

## 2018-11-05 ENCOUNTER — Telehealth: Payer: Self-pay | Admitting: Pediatrics

## 2018-11-05 NOTE — Telephone Encounter (Signed)
It is very unlikely that the [place] shot would have given him the high  fever, cold chills and sweats.  He should self quarantine for 14 days after his symptoms went away .  He should have antibody testing to COVID 4 weeks after symptoms go away.  He may get his next [place] injection

## 2018-11-05 NOTE — Telephone Encounter (Signed)
Pt is addiment that these symptoms are from the fasenra shot not covid.He said he looked up the side affects of the fasenra injection and that this was also listed as the side affects. He stated he will do the injection 1 more and if the symptoms happen again he will go back to [employer]. Only positive from fasenra for him is he has less mucus. He stated he sow his pcp yesterday and they do not believe it was covid.

## 2018-11-05 NOTE — Telephone Encounter (Signed)
Please advise 

## 2018-11-05 NOTE — Telephone Encounter (Signed)
PT called to report possible fasenra shot adverse reaction - side effects: started and lasted between 4/14- 4/24 high fever, cold chills, sweats, muscle aches/pains. Lasted for 5 days, then dissipated. Fever lasted 3 days at highest temp 102. Only remaining sympton is a constant headache, has not gone away.  Did not get tested for flu or covid. Self quarantined.   Wants to know if he should take the fasenra shot?

## 2018-11-06 NOTE — Telephone Encounter (Signed)
I talked to the patient.  A lot of his symptoms were COVID like.  I recommend that he have an antibody test to COVID-19 in about a month which would be 6 weeks after his symptoms.  We will give him 1 more Fasenra shot on May 12 and see if he has any adverse reactions to it.  His peak flow meter readings have been around 250 L/s.  In the past the highest peak flows were around 400 L/s.  Has been Xolair for many years

## 2018-11-12 DIAGNOSIS — J4550 Severe persistent asthma, uncomplicated: Secondary | ICD-10-CM | POA: Diagnosis not present

## 2018-11-13 ENCOUNTER — Other Ambulatory Visit: Payer: Self-pay

## 2018-11-13 ENCOUNTER — Ambulatory Visit (INDEPENDENT_AMBULATORY_CARE_PROVIDER_SITE_OTHER): Payer: Medicare Other

## 2018-11-13 DIAGNOSIS — J4550 Severe persistent asthma, uncomplicated: Secondary | ICD-10-CM

## 2018-11-27 ENCOUNTER — Encounter: Payer: Self-pay | Admitting: Pediatrics

## 2018-11-27 ENCOUNTER — Ambulatory Visit (INDEPENDENT_AMBULATORY_CARE_PROVIDER_SITE_OTHER): Payer: Medicare Other | Admitting: Pediatrics

## 2018-11-27 ENCOUNTER — Other Ambulatory Visit: Payer: Self-pay

## 2018-11-27 VITALS — BP 98/66 | HR 70 | Temp 97.6°F | Resp 16

## 2018-11-27 DIAGNOSIS — J4550 Severe persistent asthma, uncomplicated: Secondary | ICD-10-CM | POA: Diagnosis not present

## 2018-11-27 DIAGNOSIS — J3089 Other allergic rhinitis: Secondary | ICD-10-CM | POA: Diagnosis not present

## 2018-11-27 DIAGNOSIS — Z952 Presence of prosthetic heart valve: Secondary | ICD-10-CM

## 2018-11-27 NOTE — Patient Instructions (Signed)
Formoterol 20 MCG's twice a day by nebulization.  About 30 minutes later use budesonide 0.5 mg twice a day by nebulization Ventolin 2 puffs every 4 hours if needed for wheezing or coughing spells or instead albuterol 0.083% - 1 unit dose every 4 hours if needed Claritin 10 mg-take 1 tablet once a day if needed for runny nose Fluticasone 1 spray per nostril twice a day if needed for stuffy nose Continue on Fasenra every 8 weeks Continue on your other medications Call us if you are not doing well on this treatment plan

## 2018-11-27 NOTE — Progress Notes (Signed)
[address] [place] [id number] Dept: [phone]  FOLLOW UP NOTE  Patient ID: Caleb Mills, male    DOB: 1950-11-07  Age: 68 y.o. MRN: [id number] Date of Office Visit: 11/27/2018  Assessment  Chief Complaint: Asthma  HPI Caleb AweJames Mills Mills for follow-up of severe asthma and allergic rhinitis.  He has had 2 doses of Fasenra and feels that his symptoms have improved.  He does not need albuterol as often and he does not have as much mucus as he did before.  He stopped using Xolair.  He is on Pulmicort 0.5-1 unit dose twice a day after using formoterol 20 MCG's twice a day by nebulization.  His nasal symptoms are controlled with Claritin 10 mg once a day if needed and fluticasone 1 spray per nostril twice a day if needed His other medications are outlined in the chart   Drug Allergies:  Allergies  Allergen Reactions  . Augmentin [Amoxicillin-Pot Clavulanate] Nausea Only  . Ciprofloxacin Other (See Comments)    Joint pain  . Spiriva Handihaler [Tiotropium Bromide Monohydrate] Other (See Comments)    Congestion worse  . Tiotropium Other (See Comments)    Congestion worse    Physical Exam: BP 98/66   Pulse 70   Temp 97.6 F (36.4 C) (Tympanic)   Resp 16   SpO2 97%    Physical Exam Vitals signs reviewed.  Constitutional:      Appearance: Normal appearance. He is normal weight.  HENT:     Head:     Comments: Eyes normal.  Ears normal.  Nose normal.  Pharynx normal. Neck:     Musculoskeletal: Neck supple.  Cardiovascular:     Rate and Rhythm: Normal rate and regular rhythm.     Comments: S1-S2 normal no murmurs Pulmonary:     Comments: Clear to percussion and auscultation.  Increased anteroposterior diameter Lymphadenopathy:     Cervical: No cervical adenopathy.  Neurological:     General: No focal deficit present.     Mental Status: He is alert and oriented to person, place, and time.  Psychiatric:        Mood and Affect: Mood normal.        Behavior: Behavior  normal.        Thought Content: Thought content normal.        Judgment: Judgment normal.     Diagnostics: FVC 2.28 L FEV1 0.82 L.  Predicted FVC 3.65 L predicted FEV1 2.67 L-this shows a moderate reduction in the forced vital capacity and a severe reduction in the FEV1 however these values are better than his last values of FVC 1.69 L and FEV1 of 0.69 L  Assessment and Plan: 1. Severe persistent asthma without complication   2. History of aortic valve replacement   3. Other allergic rhinitis     No orders of the defined types were placed in this encounter.   Patient Instructions  Formoterol 20 MCG's twice a day by nebulization.  About 30 minutes later use budesonide 0.5 mg twice a day by nebulization Ventolin 2 puffs every 4 hours if needed for wheezing or coughing spells or instead albuterol 0.083% - 1 unit dose every 4 hours if needed Claritin 10 mg-take 1 tablet once a day if needed for runny nose Fluticasone 1 spray per nostril twice a day if needed for stuffy nose Continue on Fasenra every 8 weeks Continue on your other medications Call us if you are not doing well on this treatment plan  Return in about 3 months (around 02/27/2019).    Thank you for the opportunity to care for this patient.  Please do not hesitate to contact me with questions.  Tonette Bihari, M.D.  [facility] [address] [place], [place] [address] [phone]

## 2018-12-06 ENCOUNTER — Other Ambulatory Visit: Payer: Self-pay | Admitting: *Deleted

## 2018-12-06 DIAGNOSIS — J4550 Severe persistent asthma, uncomplicated: Secondary | ICD-10-CM

## 2018-12-06 MED ORDER — BUDESONIDE 0.5 MG/2ML IN SUSP: 0.5000 mg | Freq: Two times a day (BID) | RESPIRATORY_TRACT | 3 refills | Status: DC

## 2018-12-11 ENCOUNTER — Ambulatory Visit: Payer: Self-pay

## 2018-12-11 DIAGNOSIS — J4550 Severe persistent asthma, uncomplicated: Secondary | ICD-10-CM | POA: Diagnosis not present

## 2018-12-12 ENCOUNTER — Other Ambulatory Visit: Payer: Self-pay

## 2018-12-12 ENCOUNTER — Ambulatory Visit (INDEPENDENT_AMBULATORY_CARE_PROVIDER_SITE_OTHER): Payer: Medicare Other

## 2018-12-12 DIAGNOSIS — J4550 Severe persistent asthma, uncomplicated: Secondary | ICD-10-CM

## 2019-02-05 DIAGNOSIS — J4550 Severe persistent asthma, uncomplicated: Secondary | ICD-10-CM | POA: Diagnosis not present

## 2019-02-06 ENCOUNTER — Ambulatory Visit (INDEPENDENT_AMBULATORY_CARE_PROVIDER_SITE_OTHER): Payer: Medicare Other

## 2019-02-06 ENCOUNTER — Other Ambulatory Visit: Payer: Self-pay

## 2019-02-06 DIAGNOSIS — J4550 Severe persistent asthma, uncomplicated: Secondary | ICD-10-CM | POA: Diagnosis not present

## 2019-02-06 MED ORDER — BENRALIZUMAB 30 MG/ML ~~LOC~~ SOSY
30.0000 mg | PREFILLED_SYRINGE | SUBCUTANEOUS | Status: DC
  Administered 2019-02-06 – 2019-12-31 (×6): 30 mg via SUBCUTANEOUS

## 2019-03-04 ENCOUNTER — Ambulatory Visit: Payer: Medicare Other | Admitting: Pediatrics

## 2019-03-12 ENCOUNTER — Ambulatory Visit: Payer: Medicare Other | Admitting: Pediatrics

## 2019-03-19 ENCOUNTER — Other Ambulatory Visit: Payer: Self-pay

## 2019-03-19 ENCOUNTER — Ambulatory Visit: Payer: Medicare Other | Admitting: Pediatrics

## 2019-03-25 ENCOUNTER — Other Ambulatory Visit: Payer: Self-pay | Admitting: Pediatrics

## 2019-04-01 DIAGNOSIS — J4550 Severe persistent asthma, uncomplicated: Secondary | ICD-10-CM | POA: Diagnosis not present

## 2019-04-02 ENCOUNTER — Other Ambulatory Visit: Payer: Self-pay

## 2019-04-02 ENCOUNTER — Ambulatory Visit: Payer: Medicare Other | Admitting: Pediatrics

## 2019-04-02 ENCOUNTER — Encounter: Payer: Self-pay | Admitting: Pediatrics

## 2019-04-02 ENCOUNTER — Ambulatory Visit (INDEPENDENT_AMBULATORY_CARE_PROVIDER_SITE_OTHER): Payer: Medicare Other

## 2019-04-02 VITALS — BP 124/80 | HR 65 | Temp 98.1°F | Resp 12

## 2019-04-02 DIAGNOSIS — J4550 Severe persistent asthma, uncomplicated: Secondary | ICD-10-CM

## 2019-04-02 DIAGNOSIS — Z952 Presence of prosthetic heart valve: Secondary | ICD-10-CM

## 2019-04-02 DIAGNOSIS — J3089 Other allergic rhinitis: Secondary | ICD-10-CM | POA: Diagnosis not present

## 2019-04-02 MED ORDER — SPIRIVA RESPIMAT 1.25 MCG/ACT IN AERS: 2.0000 | INHALATION_SPRAY | Freq: Every day | RESPIRATORY_TRACT | 3 refills | Status: DC

## 2019-04-02 NOTE — Progress Notes (Signed)
[address] [place] [address] Dept: [phone]  FOLLOW UP NOTE  Patient ID: Caleb Mills, male    DOB: [birth year]  Age: 68 y.o. MRN: [id number] Date of Office Visit: 04/02/2019  Assessment  Chief Complaint: Asthma  HPI [name] presents for follow-up of for asthma and allergic rhinitis.  Now that he is on Fasenra every 8 weeks, he finds that he is having more runny nose , stuffy nose and itchy eyes than when he was on Xolair.  He has improved on Fasenra and feels that he can breathe better.  He is on formoterol 20 MCG's twice a day followed by budesonide 0.5 mg twice a day.  He was given a generic albuterol inhaler made by [employer] which was about  one third as effective as a Ventolin inhaler.  He now has a Ventolin inhaler.  In the past he used the Spiriva HandiHaler with a capsule and did not feel that he had much improvement in his asthma.  Other current medications are outlined in the chart   Drug Allergies:  Allergies  Allergen Reactions   Augmentin [Amoxicillin-Pot Clavulanate] Nausea Only   Ciprofloxacin Other (See Comments)    Joint pain    Physical Exam: BP 124/80    Pulse 65    Temp 98.1 F (36.7 C) (Oral)    Resp 12    SpO2 98%    Physical Exam Constitutional:      Appearance: Normal appearance. He is normal weight.  HENT:     Head:     Comments: Eyes normal.  Ears normal.  Nose normal.  Pharynx normal. Cardiovascular:     Rate and Rhythm: Normal rate and regular rhythm.     Comments: S1-S2 normal no murmurs Pulmonary:     Comments: Clear to percussion and auscultation except for some decreased breath sounds at the bases Neurological:     General: No focal deficit present.     Mental Status: He is alert and oriented to person, place, and time. Mental status is at baseline.  Psychiatric:        Mood and Affect: Mood normal.        Behavior: Behavior normal.        Thought Content: Thought content normal.        Judgment: Judgment  normal.     Diagnostics: FVC 2.36 L FEV1 0.83 L.  Predicted FVC 3.61 L predicted FEV1 2.64 L.  After 2 puffs of Spiriva 1.25 MCG's he had an FVC of 2.63 L and an FEV1 of 0.94 L 20 minutes later.  This shows a moderate reduction in the forced vital capacity and a  severe reduction in the FEV1 with significant improvement after the Spiriva Respimat  Assessment and Plan: 1. Severe persistent asthma without complication   2. History of aortic valve replacement   3. Other allergic rhinitis     Meds ordered this encounter  Medications   Tiotropium Bromide Monohydrate (SPIRIVA RESPIMAT) 1.25 MCG/ACT AERS    Sig: Inhale 2 puffs into the lungs daily.    Dispense:  12 g    Refill:  3    PT WILL CALL WHEN NEEDED    Patient Instructions  Zyrtec 10 mg-take 1 tablet once a day if needed for runny nose or itchy eyes instead of Claritin Fluticasone 1 spray per nostril twice a day if needed for stuffy nose Opcon-A-1 drop 3 times a day if needed for itchy eyes  Formoterol 20 MCG's twice a  day by nebulization.  About 30 minutes later  use budesonide 0.5 mg-1 unit dose twice a day  In the morning, use Spiriva Respimat 1.25-2 puffs every 24 hours.  Administer the Spiriva about 30 minutes after you use  Formoterol and 5 minutes before you use budesonide  Ventolin 2 puffs every 4 hours if needed for wheezing or coughing spells or instead albuterol 0.083% - 1 unit dose every 4 hours if needed.  You may use Ventolin 2 puffs 5 to 15 minutes before exercise  Continue Fasenra every 8 weeks Get a flu vaccination  Call me if you are not doing well on this treatment plan Continue on your other medications   Return in about 2 weeks (around 04/16/2019).    Thank you for the opportunity to care for this patient.  Please do not hesitate to contact me with questions.  Penne Lash, M.D.  [facility] [address] [place], [place] [address] [phone]

## 2019-04-02 NOTE — Patient Instructions (Addendum)
Zyrtec 10 mg-take 1 tablet once a day if needed for runny nose or itchy eyes instead of Claritin Fluticasone 1 spray per nostril twice a day if needed for stuffy nose Opcon-A-1 drop 3 times a day if needed for itchy eyes  Formoterol 20 MCG's twice a day by nebulization.  About 30 minutes later  use budesonide 0.5 mg-1 unit dose twice a day  In the morning, use Spiriva Respimat 1.25-2 puffs every 24 hours.  Administer the Spiriva about 30 minutes after you use  Formoterol and 5 minutes before you use budesonide  Ventolin 2 puffs every 4 hours if needed for wheezing or coughing spells or instead albuterol 0.083% - 1 unit dose every 4 hours if needed.  You may use Ventolin 2 puffs 5 to 15 minutes before exercise  Continue Fasenra every 8 weeks Get a flu vaccination  Call me if you are not doing well on this treatment plan Continue on your other medications

## 2019-04-03 ENCOUNTER — Ambulatory Visit: Payer: Self-pay

## 2019-04-15 ENCOUNTER — Encounter: Payer: Self-pay | Admitting: Pediatrics

## 2019-04-15 ENCOUNTER — Other Ambulatory Visit: Payer: Self-pay

## 2019-04-15 ENCOUNTER — Ambulatory Visit (INDEPENDENT_AMBULATORY_CARE_PROVIDER_SITE_OTHER): Payer: Medicare Other | Admitting: Pediatrics

## 2019-04-15 VITALS — BP 104/62 | HR 62 | Temp 97.9°F | Resp 16

## 2019-04-15 DIAGNOSIS — J4550 Severe persistent asthma, uncomplicated: Secondary | ICD-10-CM | POA: Diagnosis not present

## 2019-04-15 DIAGNOSIS — J3089 Other allergic rhinitis: Secondary | ICD-10-CM

## 2019-04-15 DIAGNOSIS — Z952 Presence of prosthetic heart valve: Secondary | ICD-10-CM

## 2019-04-15 MED ORDER — MONTELUKAST SODIUM 10 MG PO TABS: ORAL_TABLET | ORAL | 3 refills | Status: DC

## 2019-04-15 NOTE — Progress Notes (Signed)
[address] [place] [address] Dept: [phone]  FOLLOW UP NOTE  Patient ID: Caleb Mills, male    DOB: 1950/10/04  Age: 68 y.o. MRN: [id number] Date of Office Visit: 04/15/2019  Assessment  Chief Complaint: Asthma  HPI Caleb Mills presents for follow-up of severe persistent asthma.  He was started on Spiriva 1.25-2 puffs every 24 hours 2 weeks ago and  he feels that  his breathing is much improved.  His  morning peak flows  have gone from 140 to 240 L/sec.  He has not had to use rescue albuterol in nebulizer or Ventolin inhaler.  He is on formoterol 20 MCG's twice a day followed by budesonide 0.5 mg -1 unit dose twice a day.  Several years ago they stopped giving him montelukast when he was started on Xolair.  He is currently on Fasenra. His nasal symptoms are controlled by Zyrtec 10 mg once a day and fluticasone 1 spray per nostril twice a day Last week he had some lab work done by his primary care doctor.  I will get the results of his CBC with differential Other current medications are outlined in the chart   Drug Allergies:  Allergies  Allergen Reactions  . Augmentin [Amoxicillin-Pot Clavulanate] Nausea Only  . Ciprofloxacin Other (See Comments)    Joint pain    Physical Exam: BP 104/62   Pulse 62   Temp 97.9 F (36.6 C) (Temporal)   Resp 16   SpO2 98%    Physical Exam Constitutional:      Appearance: Normal appearance. He is normal weight.  HENT:     Head:     Comments: Eyes normal.  Ears normal.  Nose normal.  Pharynx normal. Neck:     Musculoskeletal: Neck supple.  Cardiovascular:     Rate and Rhythm: Normal rate and regular rhythm.     Comments: S1-S2 normal no murmurs Pulmonary:     Comments: Clear to percussion and  auscultation except for some decreased breath sounds at the bases Lymphadenopathy:     Cervical: No cervical adenopathy.  Skin:    Comments: No cyanosis  Neurological:     General: No focal deficit present.     Mental Status: He is  alert and oriented to person, place, and time. Mental status is at baseline.     Diagnostics: FVC 2.23 L FEV1 0.93 L.  Predicted FVC 3.61 L predicted FEV1 2.64 L-this shows a severe reduction in the forced vital capacity and the FEV1 but he feels much improved.  Two  weeks ago his FVC was 2.36 L and the FEV1 0.83 L  Assessment and Plan: 1. Severe persistent asthma without complication   2. History of aortic valve replacement   3. Other allergic rhinitis     Meds ordered this encounter  Medications  . montelukast (SINGULAIR) 10 MG tablet    Sig: Take 1 tablet at night to prevent coughing or wheezing    Dispense:  90 tablet    Refill:  3    Patient Instructions  Zyrtec 10 mg-take 1 tablet once a day for runny nose or itchy eyes Fluticasone 1 spray per nostril twice a day for stuffy nose Opcon-A-1 drop 3 times a day if needed for itchy eyes  Montelukast 10 mg-take 1 tablet at night to prevent coughing or wheezing to see if it helps you in addition to your other medications Formoterol 20 MCG's twice a day by nebulization.  About 30 minutes later use budesonide 0.5-1  unit dose twice a day In the morning use Spiriva Respimat 1.25-2 puffs every 24 hours.  Administer the Spiriva about 30 minutes after using  formoterol and  15 minutes before you use budesonide  Ventolin 2 puffs every 4 hours if needed for wheezing or coughing spells or instead albuterol 0.083% 1 unit dose every 4 hours  if  needed  Continue Fasenra every 8 weeks  Continue on your other medications Call us if you are not doing well on this treatment plan   Return in about 4 weeks (around 05/13/2019).    Thank you for the opportunity to care for this patient.  Please do not hesitate to contact me with questions.  Penne Lash, M.D.  [facility] [address] [place], [place] [address] [phone]

## 2019-04-15 NOTE — Patient Instructions (Addendum)
Zyrtec 10 mg-take 1 tablet once a day for runny nose or itchy eyes Fluticasone 1 spray per nostril twice a day for stuffy nose Opcon-A-1 drop 3 times a day if needed for itchy eyes  Montelukast 10 mg-take 1 tablet at night to prevent coughing or wheezing to see if it helps you in addition to your other medications Formoterol 20 MCG's twice a day by nebulization.  About 30 minutes later use budesonide 0.5-1 unit dose twice a day In the morning use Spiriva Respimat 1.25-2 puffs every 24 hours.  Administer the Spiriva about 30 minutes after using  formoterol and  15 minutes before you use budesonide  Ventolin 2 puffs every 4 hours if needed for wheezing or coughing spells or instead albuterol 0.083% 1 unit dose every 4 hours  if  needed  Continue Fasenra every 8 weeks  Continue on your other medications Call us if you are not doing well on this treatment plan

## 2019-05-21 ENCOUNTER — Ambulatory Visit: Payer: Medicare Other | Admitting: Pediatrics

## 2019-05-27 DIAGNOSIS — J4550 Severe persistent asthma, uncomplicated: Secondary | ICD-10-CM | POA: Diagnosis not present

## 2019-05-28 ENCOUNTER — Ambulatory Visit: Payer: Medicare Other | Admitting: Pediatrics

## 2019-05-28 ENCOUNTER — Ambulatory Visit (INDEPENDENT_AMBULATORY_CARE_PROVIDER_SITE_OTHER): Payer: Medicare Other

## 2019-05-28 ENCOUNTER — Other Ambulatory Visit: Payer: Self-pay

## 2019-05-28 ENCOUNTER — Ambulatory Visit: Payer: Self-pay

## 2019-05-28 DIAGNOSIS — J4550 Severe persistent asthma, uncomplicated: Secondary | ICD-10-CM | POA: Diagnosis not present

## 2019-05-29 ENCOUNTER — Other Ambulatory Visit: Payer: Self-pay | Admitting: Pediatrics

## 2019-06-09 ENCOUNTER — Other Ambulatory Visit: Payer: Self-pay | Admitting: Allergy and Immunology

## 2019-06-09 DIAGNOSIS — J4550 Severe persistent asthma, uncomplicated: Secondary | ICD-10-CM

## 2019-06-11 ENCOUNTER — Ambulatory Visit: Payer: Medicare Other | Admitting: Pediatrics

## 2019-07-22 DIAGNOSIS — J4550 Severe persistent asthma, uncomplicated: Secondary | ICD-10-CM | POA: Diagnosis not present

## 2019-07-23 ENCOUNTER — Other Ambulatory Visit: Payer: Self-pay

## 2019-07-23 ENCOUNTER — Ambulatory Visit (INDEPENDENT_AMBULATORY_CARE_PROVIDER_SITE_OTHER): Payer: Medicare Other

## 2019-07-23 DIAGNOSIS — J4550 Severe persistent asthma, uncomplicated: Secondary | ICD-10-CM

## 2019-08-03 ENCOUNTER — Other Ambulatory Visit: Payer: Self-pay | Admitting: Pediatrics

## 2019-08-13 ENCOUNTER — Other Ambulatory Visit: Payer: Self-pay

## 2019-08-13 ENCOUNTER — Ambulatory Visit (INDEPENDENT_AMBULATORY_CARE_PROVIDER_SITE_OTHER): Payer: Medicare Other | Admitting: Pediatrics

## 2019-08-13 ENCOUNTER — Encounter: Payer: Self-pay | Admitting: Pediatrics

## 2019-08-13 VITALS — BP 118/70 | HR 74 | Temp 96.4°F | Resp 16

## 2019-08-13 DIAGNOSIS — J3089 Other allergic rhinitis: Secondary | ICD-10-CM

## 2019-08-13 DIAGNOSIS — J4550 Severe persistent asthma, uncomplicated: Secondary | ICD-10-CM

## 2019-08-13 DIAGNOSIS — Z952 Presence of prosthetic heart valve: Secondary | ICD-10-CM | POA: Diagnosis not present

## 2019-08-13 MED ORDER — ALBUTEROL SULFATE (2.5 MG/3ML) 0.083% IN NEBU: INHALATION_SOLUTION | RESPIRATORY_TRACT | 1 refills | Status: DC

## 2019-08-13 NOTE — Progress Notes (Signed)
[address] [place] [address] Dept: [phone]  FOLLOW UP NOTE  Patient ID: Caleb Mills, male    DOB: 1951/04/18  Age: 69 y.o. MRN: [id number] Date of Office Visit: 08/13/2019  Assessment  Chief Complaint: Asthma  HPI Sara Selvidge presents for follow-up of asthma and allergic rhinitis.  In April of last year we switched from [employer] to [place] to help control his asthma.  6 to 8 months later he began to develop  severe upper airway symptoms of a runny nose, sneezing, stuffy nose and itchy eyes.  His peak flows have started falling.  His family doctor gave him  prednisone to take 5 mg for a few days when the allergies were flaring up.  He is on Claritin 10 mg once a day and fluticasone 1 spray per nostril twice a day  He is on Fasenra every 8 weeks.  Montelukast 10 mg once a day, formoterol 20 MCG's twice a day followed by budesonide 0.5-1 unit dose twice a day.  In the morning he uses Spiriva Respimat 1.25 -2 puffs once a day before using budesonide but after using formoterol.  He has been having to use albuterol before the Perforomist in the morning   Drug Allergies:  Allergies  Allergen Reactions  . Augmentin [Amoxicillin-Pot Clavulanate] Nausea Only  . Ciprofloxacin Other (See Comments)    Joint pain    Physical Exam: BP 118/70   Pulse 74   Temp (!) 96.4 F (35.8 C) (Temporal)   Resp 16   SpO2 99%    Physical Exam Vitals reviewed.  Constitutional:      Appearance: Normal appearance. He is normal weight.  HENT:     Head:     Comments: Eyes normal.  Ears normal.  Nose mild swelling of  nasal turbinates.  Pharynx normal. Cardiovascular:     Rate and Rhythm: Normal rate and regular rhythm.     Comments: S1-S2 normal no murmurs Pulmonary:     Comments: Clear to percussion auscultation except for mild wheezing on end expiration Musculoskeletal:     Cervical back: Neck supple.  Lymphadenopathy:     Cervical: No cervical adenopathy.  Neurological:     General: No  focal deficit present.     Mental Status: He is alert and oriented to person, place, and time. Mental status is at baseline.  Psychiatric:        Mood and Affect: Mood normal.        Behavior: Behavior normal.        Thought Content: Thought content normal.        Judgment: Judgment normal.     Diagnostics: FVC 2.47 L FEV1 0.96 L.  Predicted FVC 3.61 L predicted FEV1 2.64 L-this shows a mild reduction in the forced vital capacity but a severe reduction in the FEV1 but stable for him  Assessment and Plan: 1. Severe persistent asthma without complication   2. History of aortic valve replacement   3. Other allergic rhinitis     Meds ordered this encounter  Medications  . albuterol (PROVENTIL) (2.5 MG/3ML) 0.083% nebulizer solution    Sig: 1 vial unti q 4-6 hours as needed for coughing or wheezing    Dispense:  300 mL    Refill:  1    Patient Instructions  Claritin 10 mg-take 1 tablet once a day for runny nose or itchy eyes Fluticasone 1 spray per nostril twice a day for stuffy nose Opcon-A-1 drop 3 times a day if needed  for itchy eyes  Montelukast 10 mg-take 1 tablet at night to prevent coughing or wheezing Albuterol followed by formoterol 20 MCG's twice a day by nebulization.  About 30 minutes later use budesonide 0.5-1 unit dose twice a day. In the morning use Spiriva Respimat 1.25 about 30 minutes after using formoterol and 15 minutes before you use budesonide   Ventolin 2 puffs every 4 hours if needed for wheezing or coughing spells or instead albuterol 0.083% 1 unit dose every 4 hours if needed Add prednisone 10 mg tablet to take 2 tablets twice a day for 3 days, 2 tablets on the fourth day, 1 tablet on the fifth day to bring your asthma under control.  Check with your insurance company how much you  will have to pay for Dupixent- dupilumab 300 mg once a month , instead of Fasenra  Continue on your other medications Call us if you are not doing well on this treatment  plan   Return in about 6 weeks (around 09/24/2019).    Thank you for the opportunity to care for this patient.  Please do not hesitate to contact me with questions.  Tonette Bihari, M.D.  [facility] [address] [place], [place] [address] [phone]

## 2019-08-13 NOTE — Patient Instructions (Addendum)
Claritin 10 mg-take 1 tablet once a day for runny nose or itchy eyes Fluticasone 1 spray per nostril twice a day for stuffy nose Opcon-A-1 drop 3 times a day if needed for itchy eyes  Montelukast 10 mg-take 1 tablet at night to prevent coughing or wheezing Albuterol followed by formoterol 20 MCG's twice a day by nebulization.  About 30 minutes later use budesonide 0.5-1 unit dose twice a day. In the morning use Spiriva Respimat 1.25 about 30 minutes after using formoterol and 15 minutes before you use budesonide   Ventolin 2 puffs every 4 hours if needed for wheezing or coughing spells or instead albuterol 0.083% 1 unit dose every 4 hours if needed Add prednisone 10 mg tablet to take 2 tablets twice a day for 3 days, 2 tablets on the fourth day, 1 tablet on the fifth day to bring your asthma under control.  Check with your insurance company how much you  will have to pay for Dupixent- dupilumab 300 mg once a month , instead of Fasenra  Continue on your other medications Call us if you are not doing well on this treatment plan

## 2019-08-20 ENCOUNTER — Telehealth: Payer: Self-pay | Admitting: *Deleted

## 2019-08-20 NOTE — Telephone Encounter (Signed)
Called patient and discussed changing over from [place] to Dupixent. Due to [facility] Ins the best and affordable option is to try and get Rx through patient assistance program. Will mail app for patient to return with income and part D card

## 2019-09-16 DIAGNOSIS — J4550 Severe persistent asthma, uncomplicated: Secondary | ICD-10-CM | POA: Diagnosis not present

## 2019-09-17 ENCOUNTER — Other Ambulatory Visit: Payer: Self-pay

## 2019-09-17 ENCOUNTER — Ambulatory Visit (INDEPENDENT_AMBULATORY_CARE_PROVIDER_SITE_OTHER): Payer: Medicare Other | Admitting: *Deleted

## 2019-09-17 DIAGNOSIS — J4550 Severe persistent asthma, uncomplicated: Secondary | ICD-10-CM | POA: Diagnosis not present

## 2019-09-23 ENCOUNTER — Other Ambulatory Visit: Payer: Self-pay

## 2019-09-23 ENCOUNTER — Encounter: Payer: Self-pay | Admitting: Pediatrics

## 2019-09-23 ENCOUNTER — Ambulatory Visit (INDEPENDENT_AMBULATORY_CARE_PROVIDER_SITE_OTHER): Payer: Medicare Other | Admitting: Pediatrics

## 2019-09-23 VITALS — BP 138/70 | HR 72 | Resp 18

## 2019-09-23 DIAGNOSIS — Z7901 Long term (current) use of anticoagulants: Secondary | ICD-10-CM | POA: Diagnosis not present

## 2019-09-23 DIAGNOSIS — Z952 Presence of prosthetic heart valve: Secondary | ICD-10-CM | POA: Diagnosis not present

## 2019-09-23 DIAGNOSIS — J3089 Other allergic rhinitis: Secondary | ICD-10-CM | POA: Diagnosis not present

## 2019-09-23 DIAGNOSIS — J4550 Severe persistent asthma, uncomplicated: Secondary | ICD-10-CM | POA: Diagnosis not present

## 2019-09-23 MED ORDER — MONTELUKAST SODIUM 10 MG PO TABS: ORAL_TABLET | ORAL | 1 refills | Status: DC

## 2019-09-23 MED ORDER — ALBUTEROL SULFATE HFA 108 (90 BASE) MCG/ACT IN AERS: 2.0000 | INHALATION_SPRAY | RESPIRATORY_TRACT | 1 refills | Status: DC | PRN

## 2019-09-23 MED ORDER — ALBUTEROL SULFATE (2.5 MG/3ML) 0.083% IN NEBU: INHALATION_SOLUTION | RESPIRATORY_TRACT | 1 refills | Status: DC

## 2019-09-23 MED ORDER — BUDESONIDE 0.5 MG/2ML IN SUSP: RESPIRATORY_TRACT | 1 refills | Status: DC

## 2019-09-23 MED ORDER — PERFOROMIST 20 MCG/2ML IN NEBU: INHALATION_SOLUTION | RESPIRATORY_TRACT | 1 refills | Status: DC

## 2019-09-23 MED ORDER — SPIRIVA RESPIMAT 1.25 MCG/ACT IN AERS: 2.0000 | INHALATION_SPRAY | Freq: Every day | RESPIRATORY_TRACT | 1 refills | Status: DC

## 2019-09-23 NOTE — Patient Instructions (Addendum)
Claritin 10 mg-take 1 tablet once a day if needed for runny nose or itchy eyes Fluticasone 1 spray per nostril twice a day for stuffy nose Opcon-A-1 drop 3 times a day if needed for itchy eyes  Montelukast 10 mg-take 1 tablet at night to prevent coughing or wheezing  In the morning use albuterol in the nebulizer followed by formoterol 20 MCG's twice a day.  Then use Spiriva Respimat 1.25 about 30 minutes after using formoterol in the morning only.  Then use budesonide 0.5-1 unit dose twice a day  Continue on  your other medications I will get the CBC results from your family doctor I will follow-up on your dupilumab request and let you know the results and decide what to do regarding Fasenra .  Returning to the use of Xolair would be one  option and Nucala would be another option.  My preference would be dupilumab because of your allergies and airway obstruction Call us if you are not doing well on this treatment plan

## 2019-09-23 NOTE — Progress Notes (Signed)
[address] [place] [address] Dept: [phone]  FOLLOW UP NOTE  Patient ID: Caleb Mills, male    DOB: Jan 20, 1951  Age: 69 y.o. MRN: [id number] Date of Office Visit: 09/23/2019  Assessment  Chief Complaint: Asthma  HPI Caleb Mills presents for follow-up of asthma his asthma improved when he took prednisone for 5 days 20 mg twice a day for 3 days, 20 mg on the fourth day, 10 mg in the fifth day starting on February 9 of this year.  His family doctor given a prescription for prednisone 5 mg tablets to take 1 tablet once a day if he has breathing difficulties.  He has not started that yet.  About 2 days after the last Fasenra injection he developed a mild headache which has continued.  He has not had glaucoma and had a recent eye exam.  He is on Claritin 10 mg once a day and fluticasone 1 spray per nostril twice a day if needed.  About 1 or 2 weeks ago he had a CBC with differential.  He has not been advised of the results  For his asthma he has had to use albuterol in a nebulizer followed by Perforomist 20 mg twice a day followed by Spiriva Respimat 1.25 MCG's in the morning.  About 30 minutes later he then uses budesonide 0.5-1 unit dose in the morning.  At night he uses albuterol followed by Perforomist followed by budesonide 0.5 mg  Other current medications are outlined in the chart  Drug Allergies:  Allergies  Allergen Reactions  . Augmentin [Amoxicillin-Pot Clavulanate] Nausea Only  . Ciprofloxacin Other (See Comments)    Joint pain    Physical Exam: BP 138/70 (BP Location: Left Arm, Patient Position: Sitting, Cuff Size: Normal)   Pulse 72   Resp 18   SpO2 98%    Physical Exam Vitals reviewed.  Constitutional:      Appearance: Normal appearance. He is normal weight.  HENT:     Head:     Comments: Eyes normal.  Ears normal.  Nose normal.  Pharynx normal. Cardiovascular:     Rate and Rhythm: Normal rate and regular rhythm.     Comments: S1-S2 normal no  murmurs Pulmonary:     Comments: Clear to percussion and auscultation.  Increased anteroposterior diameter Lymphadenopathy:     Cervical: No cervical adenopathy.  Neurological:     General: No focal deficit present.     Mental Status: He is alert and oriented to person, place, and time. Mental status is at baseline.  Psychiatric:        Mood and Affect: Mood normal.        Behavior: Behavior normal.        Thought Content: Thought content normal.        Judgment: Judgment normal.     Diagnostics: FVC 2.40 L FEV1 1.01 L.  Predicted FVC 3.61 L predicted FEV1 2.64 L-this shows a mild reduction in the forced vital capacity and a severe reduction in the FEV1 , but stable for him  Assessment and Plan: 1. Other allergic rhinitis   2. Severe persistent asthma, unspecified whether complicated   3. History of aortic valve replacement   4. Prophylactic use of warfarin for venous thromboembolism   5. Severe persistent asthma without complication     Meds ordered this encounter  Medications  . albuterol (PROVENTIL) (2.5 MG/3ML) 0.083% nebulizer solution    Sig: 1 vial unti q 4-6 hours as needed for coughing or  wheezing    Dispense:  300 mL    Refill:  1  . albuterol (VENTOLIN HFA) 108 (90 Base) MCG/ACT inhaler    Sig: Inhale 2 puffs into the lungs every 4 (four) hours as needed for wheezing or shortness of breath.    Dispense:  54 g    Refill:  1    Please give brand name and it has been approved  . budesonide (PULMICORT) 0.5 MG/2ML nebulizer solution    Sig: USE 2 ML(0.5 MG) VIA NEBULIZER TWICE DAILY    Dispense:  360 mL    Refill:  1  . formoterol (PERFOROMIST) 20 MCG/2ML nebulizer solution    Sig: Use twice a day by nebulization    Dispense:  360 mL    Refill:  1  . Tiotropium Bromide Monohydrate (SPIRIVA RESPIMAT) 1.25 MCG/ACT AERS    Sig: Inhale 2 puffs into the lungs daily.    Dispense:  36 g    Refill:  1    PT WILL CALL WHEN NEEDED  . montelukast (SINGULAIR) 10 MG tablet     Sig: Take 1 tablet at night to prevent coughing or wheezing    Dispense:  90 tablet    Refill:  1    Patient Instructions  Claritin 10 mg-take 1 tablet once a day if needed for runny nose or itchy eyes Fluticasone 1 spray per nostril twice a day for stuffy nose Opcon-A-1 drop 3 times a day if needed for itchy eyes  Montelukast 10 mg-take 1 tablet at night to prevent coughing or wheezing  In the morning use albuterol in the nebulizer followed by formoterol 20 MCG's twice a day.  Then use Spiriva Respimat 1.25 about 30 minutes after using formoterol in the morning only.  Then use budesonide 0.5-1 unit dose twice a day  Continue on  your other medications I will get the CBC results from your family doctor I will follow-up on your dupilumab request and let you know the results and decide what to do regarding Fasenra .  Returning to the use of Xolair would be one  option and Nucala would be another option.  My preference would be dupilumab because of your allergies and airway obstruction Call us if you are not doing well on this treatment plan   Return in about 6 weeks (around 11/04/2019).    Thank you for the opportunity to care for this patient.  Please do not hesitate to contact me with questions.  Penne Lash, M.D.  [facility] [address] [place], [place] [address] [phone]

## 2019-09-30 ENCOUNTER — Telehealth: Payer: Self-pay | Admitting: *Deleted

## 2019-09-30 NOTE — Telephone Encounter (Signed)
Tell him that he had no eosinophils noted in his last CBC with differential.  If [place] Challenger stops working, dupilumab would be the next choice

## 2019-09-30 NOTE — Telephone Encounter (Signed)
I had submitted patient to [facility] to get patient on free Dupixent sue to Ins. Patient called today and advised he has done reading up and decided he doesn't want to start Dupixent. He advised that he is doing okay on his [place] Challenger and daily meds and wants to stick with current regimen.

## 2019-09-30 NOTE — Telephone Encounter (Signed)
Left a voice mail for pt to return call.

## 2019-10-01 NOTE — Telephone Encounter (Signed)
Pt informed for Dr. Beaulah Dinning message

## 2019-10-08 ENCOUNTER — Other Ambulatory Visit: Payer: Self-pay | Admitting: Pediatrics

## 2019-10-08 DIAGNOSIS — J4550 Severe persistent asthma, uncomplicated: Secondary | ICD-10-CM

## 2019-10-21 ENCOUNTER — Other Ambulatory Visit: Payer: Self-pay | Admitting: Family Medicine

## 2019-10-28 ENCOUNTER — Other Ambulatory Visit: Payer: Self-pay

## 2019-10-28 NOTE — Telephone Encounter (Signed)
Refill albuterol 0.083% and fax'd to [facility] at [phone].

## 2019-11-06 ENCOUNTER — Telehealth: Payer: Self-pay

## 2019-11-06 MED ORDER — PREDNISONE 10 MG PO TABS: ORAL_TABLET | ORAL | 0 refills | Status: DC

## 2019-11-06 MED ORDER — AZITHROMYCIN 250 MG PO TABS: ORAL_TABLET | ORAL | 0 refills | Status: DC

## 2019-11-06 NOTE — Telephone Encounter (Signed)
Zithromax and prednisone sent to [facility]. Patient informed.

## 2019-11-06 NOTE — Telephone Encounter (Signed)
Call in Zithromax 250 mg-take 2 tablets today, then 1 tablet once a day for the next 4 days at about the same time Call in prednisone 10 mg tablet-take 2 tablets twice a day for 3 days, 2 tablets on the fourth day, 1 tablet on the fifth day.  Continue on all his other medications

## 2019-11-06 NOTE — Telephone Encounter (Signed)
Patient called, is having coughing and wheezing with a lot of congestion that has settled in his chest, when he can cough something up it's yellow mucus. Peak flow is down=180-200 and after asthma tx=240. Please advise Z-Pack or/and prednisone has helped in the past. Has an appointment with you on 11/12/19.

## 2019-11-12 ENCOUNTER — Other Ambulatory Visit: Payer: Self-pay

## 2019-11-12 ENCOUNTER — Telehealth: Payer: Self-pay

## 2019-11-12 ENCOUNTER — Ambulatory Visit (INDEPENDENT_AMBULATORY_CARE_PROVIDER_SITE_OTHER): Payer: Medicare Other | Admitting: Pediatrics

## 2019-11-12 ENCOUNTER — Encounter: Payer: Self-pay | Admitting: Pediatrics

## 2019-11-12 ENCOUNTER — Ambulatory Visit: Payer: Medicare Other

## 2019-11-12 VITALS — BP 118/62 | HR 72 | Temp 98.2°F | Resp 18 | Ht 63.7 in | Wt 148.8 lb

## 2019-11-12 DIAGNOSIS — J449 Chronic obstructive pulmonary disease, unspecified: Secondary | ICD-10-CM

## 2019-11-12 DIAGNOSIS — J019 Acute sinusitis, unspecified: Secondary | ICD-10-CM | POA: Insufficient documentation

## 2019-11-12 DIAGNOSIS — Z952 Presence of prosthetic heart valve: Secondary | ICD-10-CM

## 2019-11-12 DIAGNOSIS — J4550 Severe persistent asthma, uncomplicated: Secondary | ICD-10-CM

## 2019-11-12 DIAGNOSIS — J4551 Severe persistent asthma with (acute) exacerbation: Secondary | ICD-10-CM | POA: Insufficient documentation

## 2019-11-12 DIAGNOSIS — Z7901 Long term (current) use of anticoagulants: Secondary | ICD-10-CM

## 2019-11-12 DIAGNOSIS — J3089 Other allergic rhinitis: Secondary | ICD-10-CM

## 2019-11-12 MED ORDER — CEFDINIR 300 MG PO CAPS: ORAL_CAPSULE | ORAL | 0 refills | Status: DC

## 2019-11-12 NOTE — Telephone Encounter (Signed)
Hi Dee! Per Dr. Beaulah Dinning, please place a pulmonary referral on Caleb Mills to Dr. [name] in [facility]. Dx Asthma and COPD. Let Dr. Beaulah Dinning know if you have any questions and update on status. Thank you. Makeba Delcastillo

## 2019-11-12 NOTE — Patient Instructions (Signed)
Asthma Prednisone 10 mg tablets. Take 2 tablets twice a day for 3 days, then take 2 tablets for 1 day, then take 1 tablet on the 5th day, then stop. Start cefdinir 300 mg capsule- take 1 capsule every 12 hours for 10 days then stop. Stop Spriva Respimat 1.25 due to symptoms Continue montelukast 10 mg take 1 tablet at night to prevent cough and wheeze. Continue Fasenra injections every 8 weeks. Continue albuterol in the nebulizer in the morning followed by formoterol 20 mcg twice a day.  Then about 20 minutes after using formoterol use budesonide 0.5-1 unit dose.  Follow the same regimen at night. Refer to Dr. [name] with [facility]  Allergic Rhinitis Continue fluticasone nasal spray 1 spray each nostril twice a day for stuffy nose Continue Claritin 10 mg take 1 tablet once a day as needed for runny nose or itchy eyes May use Opcon-A-1 drop three times a day as needed for itchy/watery eyes.  Continue all other scheduled medications. Please let us know if this treatment plan is not working well for you. Schedule follow-up appointment in 1 month

## 2019-11-12 NOTE — Progress Notes (Signed)
[address] [place] [address] Dept: [phone]  FOLLOW UP NOTE  Patient ID: Caleb Mills, male    DOB: 07/29/1950  Age: 69 y.o. MRN: [id number] Date of Office Visit: 11/12/2019  Assessment  Chief Complaint: Cough (green sputum. sx x 20 days.), Wheezing, Sinusitis (green mucus.), and Medication Problem (spiriva respimat causing tachycardia.  d/c spiriva)  HPI Caleb Mills is a 69 year old male that presents for follow-up of severe persistent asthma and allergic rhinitis.  He was last seen on September 23, 2019 by Dr. Beaulah Dinning.  Asthma is reported as not controlled with the use of albuterol nebulizer in the morning followed by Perforomist 20 mg twice a day followed by Spiriva Respimat 1.25 mcg in the morning.  About 30 minutes later he then uses budesonide 0.5 mg - 1 unit dose.  At night he uses albuterol followed by Perforomist followed by budesonide 0.5 mg.  Around April 8 of this year he stopped Spiriva due to having symptoms of increased heart rate and blood pressure, temple soreness, eyes feeling fuzzy, dry and scratchy.  He was also seeing spots.  In the beginning his heart rate and blood pressure would only be increased for a few minutes, but this then increased to taking a couple hours to decrease his heart rate and blood pressure.  The symptoms have been going on for some time now and after starting to look through his medications and looking at the side effects he felt Spiriva was causing these symptoms.  Since being off the Spiriva he is no longer having any of the symptoms.   He was given Zithromax and a prednisone taper on May 5 for productive cough and wheezing.  The symptoms started about 20 days ago per the patient.  He reports that he continues to have the symptoms after finishing both Z-Pak and prednisone a couple of days ago. His cough is productive with yellow-green sputum and he reports wheezing, tightness in his chest and shortness of breath all the time.  He is also having  nocturnal symptoms and will use his albuterol 3-4 times a night.  Also, he is using his albuterol several times throughout the day.  He denies any fever, chills, chest pain, and palpitations. Since his last visit he has not required any trips to the emergency room or urgent care.  Allergic rhinitis is reported as not well controlled with the use of fluticasone nasal spray 1 spray each nostril twice a day and Claritin 10 mg once a day.  He reports blowing yellow-green nasal drainage after using saline spray.  He also reports nasal congestion and postnasal drainage.  He is scheduled to get a Fasenra injection today   Drug Allergies:  Allergies  Allergen Reactions  . Augmentin [Amoxicillin-Pot Clavulanate] Nausea Only  . Ciprofloxacin Other (See Comments)    Joint pain    Physical Exam: BP 118/62 (BP Location: Right Arm, Patient Position: Sitting, Cuff Size: Normal)   Pulse 72   Temp 98.2 F (36.8 C) (Oral)   Resp 18   Ht 5' 3.7" (1.618 m)   Wt 148 lb 12.8 oz (67.5 kg)   SpO2 96%   BMI 25.78 kg/m    Physical Exam Constitutional:      Appearance: Normal appearance.  HENT:     Head: Normocephalic and atraumatic.     Comments: Pharynx normal. Eyes normal. Ears normal. Nose normal.    Right Ear: Tympanic membrane, ear canal and external ear normal.     Left Ear:  Tympanic membrane, ear canal and external ear normal.     Nose: Nose normal.     Mouth/Throat:     Mouth: Mucous membranes are moist.     Pharynx: Oropharynx is clear.  Eyes:     Conjunctiva/sclera: Conjunctivae normal.  Cardiovascular:     Rate and Rhythm: Normal rate and regular rhythm.     Pulses: Normal pulses.     Heart sounds: Normal heart sounds.  Pulmonary:     Effort: Pulmonary effort is normal.     Breath sounds: Wheezing present.     Comments: Expiratory wheezing throughout all lung fields. Skin:    General: Skin is warm.  Neurological:     General: No focal deficit present.     Mental Status: He is  alert and oriented to person, place, and time.  Psychiatric:        Mood and Affect: Mood normal.        Behavior: Behavior normal.        Thought Content: Thought content normal.        Judgment: Judgment normal.     Diagnostics: FVC 2.44 L, FEV1 0.93 L.  Predicted FVC 3.49 L, FEV1 2.54 L.  Spirometry suggest mild restriction and severe airway obstruction.  Assessment and Plan: 1. Severe persistent asthma with acute exacerbation   2. Asthma-COPD overlap syndrome ([place])   3. Acute non-recurrent sinusitis, unspecified location   4. Other allergic rhinitis   5. History of aortic valve replacement   6. Prophylactic use of warfarin for venous thromboembolism     Meds ordered this encounter  Medications  . cefdinir (OMNICEF) 300 MG capsule    Sig: One capsule every 12 hours for 10 days for infection.    Dispense:  20 capsule    Refill:  0    Patient Instructions  Asthma Prednisone 10 mg tablets. Take 2 tablets twice a day for 3 days, then take 2 tablets for 1 day, then take 1 tablet on the 5th day, then stop. Start cefdinir 300 mg capsule- take 1 capsule every 12 hours for 10 days then stop. Stop Spriva Respimat 1.25 due to symptoms Continue montelukast 10 mg take 1 tablet at night to prevent cough and wheeze. Continue Fasenra injections every 8 weeks. Continue albuterol in the nebulizer in the morning followed by formoterol 20 mcg twice a day.  Then about 20 minutes after using formoterol use budesonide 0.5-1 unit dose.  Follow the same regimen at night. Refer to Dr. Camillo Flaming with [facility]  Allergic Rhinitis Continue fluticasone nasal spray 1 spray each nostril twice a day for stuffy nose Continue Claritin 10 mg take 1 tablet once a day as needed for runny nose or itchy eyes May use Opcon-A-1 drop three times a day as needed for itchy/watery eyes.  Continue all other scheduled medications. Please let us know if this treatment plan is not working well for  you. Schedule follow-up appointment in 1 month   Return in about 4 weeks (around 12/10/2019), or if symptoms worsen or fail to improve.   Thank you for the opportunity to care for this patient.  Please do not hesitate to contact me with questions.  Althea Charon, FNP [facility]   I have provided oversight concerning Enis Gash' evaluation and treatment of this patient's health issues addressed during today's encounter. I agree with the assessment and therapeutic plan as outlined in the note.   Thank you for  the opportunity to care for this patient.  Please do not hesitate to contact me with questions.  Tonette Bihari, M.D.  [facility] [address] [place], [place] [address] [phone]

## 2019-11-12 NOTE — Progress Notes (Signed)
This encounter was created in error - please disregard.

## 2019-11-13 NOTE — Telephone Encounter (Signed)
Referral has been faxed to [phone] & [phone].  Caleb Mills can you call them on Friday to make sure they received this referral? P: : [phone] Thank You

## 2019-11-15 NOTE — Telephone Encounter (Signed)
Called [facility] to check on referral, Tiffany states they did not receive a referral, but put it in over the phone and would call patient to schedule. Refaxed referral to [phone].

## 2019-11-18 NOTE — Telephone Encounter (Signed)
Patient has been scheduled for 12/04/2019 at 9:20am with Dr. [name].

## 2019-11-25 ENCOUNTER — Encounter: Payer: Self-pay | Admitting: Pediatrics

## 2019-11-27 ENCOUNTER — Ambulatory Visit: Payer: Self-pay | Admitting: *Deleted

## 2019-11-27 NOTE — Progress Notes (Signed)
Opened in error

## 2019-12-03 ENCOUNTER — Other Ambulatory Visit: Payer: Self-pay | Admitting: Pediatrics

## 2019-12-16 ENCOUNTER — Other Ambulatory Visit: Payer: Self-pay | Admitting: Pediatrics

## 2019-12-25 ENCOUNTER — Telehealth: Payer: Self-pay | Admitting: Allergy and Immunology

## 2019-12-25 NOTE — Telephone Encounter (Signed)
Spoke with pt- he agreed to see Caleb Mills next week before his next [place] injection. He is using an OTC eye lubricate which is helping with his eye issues.

## 2019-12-25 NOTE — Telephone Encounter (Signed)
Patient request a call back from nurse about his current condition. And whether of not Dr [name] has consulted with this office.

## 2019-12-25 NOTE — Telephone Encounter (Signed)
Dr. Beaulah Dinning referred pt to Dr. [name]. He saw Dr. [name] on June 9th. That doctor recommended that he switch back to Xolair and stop the [place]. He also started him on azelastine nasal spray and Anoro Ellipta. He has been using those both for 2 weeks. For the past 4-5 days the pt has had eye problems- itchy red eyes with discharge and feels like there is sand in them and also itchy scalp. He s not sure if these sympoms are from the new meds or if fasenra isn't helping his allergies. He has stopped using the azelastine. I informed him that Dr. Beaulah Dinning was out of the office this week.

## 2019-12-25 NOTE — Telephone Encounter (Signed)
Can he come in for an acute visit today on Anne's schedule?

## 2019-12-25 NOTE — Telephone Encounter (Signed)
He is already on the schdulefor a follow up with anne on the June 29th do you want to try and get him in sooner?

## 2019-12-30 DIAGNOSIS — J4550 Severe persistent asthma, uncomplicated: Secondary | ICD-10-CM | POA: Diagnosis not present

## 2019-12-31 ENCOUNTER — Other Ambulatory Visit: Payer: Self-pay

## 2019-12-31 ENCOUNTER — Encounter: Payer: Self-pay | Admitting: Family Medicine

## 2019-12-31 ENCOUNTER — Ambulatory Visit (INDEPENDENT_AMBULATORY_CARE_PROVIDER_SITE_OTHER): Payer: Medicare Other

## 2019-12-31 ENCOUNTER — Ambulatory Visit (INDEPENDENT_AMBULATORY_CARE_PROVIDER_SITE_OTHER): Payer: Medicare Other | Admitting: Family Medicine

## 2019-12-31 ENCOUNTER — Ambulatory Visit: Payer: Self-pay

## 2019-12-31 VITALS — BP 124/64 | HR 93 | Temp 98.3°F | Resp 16

## 2019-12-31 DIAGNOSIS — J4489 Other specified chronic obstructive pulmonary disease: Secondary | ICD-10-CM

## 2019-12-31 DIAGNOSIS — J3089 Other allergic rhinitis: Secondary | ICD-10-CM | POA: Diagnosis not present

## 2019-12-31 DIAGNOSIS — J4551 Severe persistent asthma with (acute) exacerbation: Secondary | ICD-10-CM

## 2019-12-31 DIAGNOSIS — H1010 Acute atopic conjunctivitis, unspecified eye: Secondary | ICD-10-CM | POA: Diagnosis not present

## 2019-12-31 DIAGNOSIS — J449 Chronic obstructive pulmonary disease, unspecified: Secondary | ICD-10-CM

## 2019-12-31 DIAGNOSIS — J4550 Severe persistent asthma, uncomplicated: Secondary | ICD-10-CM

## 2019-12-31 NOTE — Progress Notes (Addendum)
[address] [place] [address] Dept: [phone]  FOLLOW UP NOTE  Patient ID: Caleb Mills, male    DOB: [birth year]  Age: 69 y.o. MRN: [id number] Date of Office Visit: 12/31/2019  Assessment  Chief Complaint: Asthma  HPI Caleb Mills is a 69 year old male who presents to the clinic for a follow up visit.He was last seen in this clinic on 11/12/2019 by Dr. Beaulah Dinning for evaluation of asthma with exacerbation, asthma COPD overlap syndrome, acute sinusitis, and allergic rhinitis.  His medical history includes history of aortic valve replacement and prophylactic use of warfarin for VTE.  At that time, he required prednisone taper 2 times, azithromycin, and and cefdinir for resolution of acute sinusitis and asthma exacerbation.  In the interim, he has been to Dr. Durwin Reges, pulmonology specialist, for further evaluation and treatment of asthma COPD overlap syndrome.  At today's visit, Ivy reports his asthma has been better controlled than at his last visit with symptoms including some shortness of breath with exertion, occasional wheeze, and he does report that he continues to have a dry cough.  He continues Anoro Ellipta 1 puff once a day and is still using albuterol several times in the day.  He reports that he has only needed to use albuterol 2 times at nighttime for the last 2 weeks.  He had previously used albuterol at least once during the night.  He does report that he occasionally uses budesonide via nebulizer for shortness of breath at nighttime. he continues Fasenra 30 mg once every 8 weeks.  He reports during the discussion with Dr. [name] that he may get more benefit from Xolair injections instead of Fasenra allergic rhinitis is reported as moderately well controlled with no nasal congestion, rhinorrhea, sneezing, or postnasal drainage.  He stopped using azelastine nasal spray as he thought this was causing his eyes to be dry and itch and has started using Flonase 1 spray in each  nostril twice a day.  Reflux is reported as well controlled with no signs or symptoms including heartburn or vomiting.  He is not taking any medication for reflux.  He reports he has cut down from 14 to 15 cups of coffee to 1 or 1-1/2 cups of coffee a day.  His current medications are listed in the chart.   Drug Allergies:  Allergies  Allergen Reactions  . Augmentin [Amoxicillin-Pot Clavulanate] Nausea Only  . Ciprofloxacin Other (See Comments)    Joint pain    Physical Exam: BP 124/64   Pulse 93   Temp 98.3 F (36.8 C) (Oral)   Resp 16   SpO2 96%    Physical Exam Vitals reviewed.  Constitutional:      Appearance: Normal appearance.  HENT:     Head: Normocephalic and atraumatic.     Right Ear: Tympanic membrane normal.     Left Ear: Tympanic membrane normal.     Nose:     Comments: Septal deviation noted.  Bilateral nares normal.  Pharynx slightly erythematous with no exudate.  Ears normal.  Eyes normal. Eyes:     Conjunctiva/sclera: Conjunctivae normal.  Cardiovascular:     Rate and Rhythm: Normal rate and regular rhythm.     Heart sounds: Normal heart sounds. No murmur heard.   Pulmonary:     Effort: Pulmonary effort is normal.     Breath sounds: Normal breath sounds.     Comments: Lungs clear to auscultation.   Musculoskeletal:  General: Normal range of motion.     Cervical back: Normal range of motion and neck supple.  Skin:    General: Skin is warm and dry.  Neurological:     Mental Status: He is alert and oriented to person, place, and time.  Psychiatric:        Mood and Affect: Mood normal.        Behavior: Behavior normal.        Thought Content: Thought content normal.        Judgment: Judgment normal.     Diagnostics: FVC 2.49, FEV1 1.03.  Predicted FVC 3.49, predicted FEV1 2.54.  Spirometry indicates mild restriction and severe airway obstruction.  This is consistent with previous spirometry readings.  Assessment and Plan: 1. Severe  persistent asthma, unspecified whether complicated   2. Asthma-COPD overlap syndrome (H[facility])   3. Other allergic rhinitis   4. Seasonal allergic conjunctivitis     Patient Instructions  Asthma Continue montelukast 10 mg once a day to prevent cough or wheeze Continue Anoro Ellipta one puff once a day to prevent cough or wheeze Continue albuterol 2 puffs every 4 hours as needed for cough or wheeze OR Instead use albuterol 0.083% solution via nebulizer one unit vial every 4 hours as needed for cough or wheeze We will complete the paperwork necessary to apply for Xolair with your insurance. Until that time, we will leave the next Fasenra injection appointment on the calendar.   Allergic rhinitis Continue Flonase 2 sprays in each nostril once a day as needed for a stuffy nose Continue azelastine 2 sprays in each nostril twice a day as needed for a runny nose Consider saline nasal rinses as needed for nasal symptoms. Use this before any medicated nasal sprays for best result Continue Claritin 10 mg once a day as needed for a runny nose  Allergic conjunctivitis Continue a lubricating eye drop Some over the counter eye drops include Pataday one drop in each eye once a day as needed for red, itchy eyes OR Zaditor one drop in each eye twice a day as needed for red itchy eyes.  Reflux Continue dietary and lifestyle modifications as listed below  Call the clinic if this treatment plan is not working well for you  Follow up in 1 month or sooner if needed.   Return in about 4 weeks (around 01/28/2020), or if symptoms worsen or fail to improve.    Thank you for the opportunity to care for this patient.  Please do not hesitate to contact me with questions.  Thermon Leyland, FNP [facility]  ________________________________________________  I have provided oversight concerning [facility]'s evaluation and treatment of this patient's health issues addressed during today's  encounter.  I agree with the assessment and therapeutic plan as outlined in the note.   Signed,   R Jorene Guest, MD

## 2019-12-31 NOTE — Patient Instructions (Addendum)
Asthma Continue montelukast 10 mg once a day to prevent cough or wheeze Continue Anoro Ellipta one puff once a day to prevent cough or wheeze Continue albuterol 2 puffs every 4 hours as needed for cough or wheeze OR Instead use albuterol 0.083% solution via nebulizer one unit vial every 4 hours as needed for cough or wheeze We will complete the paperwork necessary to apply for Xolair with your insurance. Until that time, we will leave the next Fasenra injection appointment on the calendar.   Allergic rhinitis Continue Flonase 2 sprays in each nostril once a day as needed for a stuffy nose Continue azelastine 2 sprays in each nostril twice a day as needed for a runny nose Consider saline nasal rinses as needed for nasal symptoms. Use this before any medicated nasal sprays for best result Continue Claritin 10 mg once a day as needed for a runny nose  Allergic conjunctivitis Continue a lubricating eye drop Some over the counter eye drops include Pataday one drop in each eye once a day as needed for red, itchy eyes OR Zaditor one drop in each eye twice a day as needed for red itchy eyes.  Reflux Continue dietary and lifestyle modifications as listed below  Call the clinic if this treatment plan is not working well for you  Follow up in 1 month or sooner if needed.

## 2020-01-09 ENCOUNTER — Telehealth: Payer: Self-pay | Admitting: Family Medicine

## 2020-01-09 NOTE — Telephone Encounter (Signed)
Patient will come in on 02/25/2020 to receive Xolair instead of Fasenra. He will call when he is on the way to the clinic. All questions answered.

## 2020-01-30 ENCOUNTER — Ambulatory Visit: Payer: Medicare Other | Admitting: Family Medicine

## 2020-02-25 ENCOUNTER — Ambulatory Visit (INDEPENDENT_AMBULATORY_CARE_PROVIDER_SITE_OTHER): Payer: Medicare Other

## 2020-02-25 ENCOUNTER — Ambulatory Visit: Payer: Medicare Other | Admitting: Family Medicine

## 2020-02-25 ENCOUNTER — Other Ambulatory Visit: Payer: Self-pay

## 2020-02-25 DIAGNOSIS — J4550 Severe persistent asthma, uncomplicated: Secondary | ICD-10-CM

## 2020-02-25 MED ORDER — OMALIZUMAB 150 MG ~~LOC~~ SOLR
150.0000 mg | SUBCUTANEOUS | Status: DC
  Administered 2020-02-25: 150 mg via SUBCUTANEOUS

## 2020-02-25 NOTE — Progress Notes (Deleted)
° °  [address] [place] [address] Dept: [phone]  FOLLOW UP NOTE  Patient ID: Fread Kottke, male    DOB: [birth year]  Age: 69 y.o. MRN: [id number] Date of Office Visit: 02/25/2020  Assessment  Chief Complaint: No chief complaint on file.  HPI Katheran Awe    Drug Allergies:  Allergies  Allergen Reactions   Augmentin [Amoxicillin-Pot Clavulanate] Nausea Only   Ciprofloxacin Other (See Comments)    Joint pain    Physical Exam: There were no vitals taken for this visit.   Physical Exam  Diagnostics:    Assessment and Plan: No diagnosis found.  No orders of the defined types were placed in this encounter.   There are no Patient Instructions on file for this visit.  No follow-ups on file.    Thank you for the opportunity to care for this patient.  Please do not hesitate to contact me with questions.  Thermon Leyland, FNP [facility]

## 2020-02-27 ENCOUNTER — Other Ambulatory Visit: Payer: Self-pay | Admitting: Family Medicine

## 2020-02-28 ENCOUNTER — Other Ambulatory Visit: Payer: Self-pay

## 2020-02-28 ENCOUNTER — Telehealth: Payer: Self-pay | Admitting: Family Medicine

## 2020-02-28 DIAGNOSIS — J4550 Severe persistent asthma, uncomplicated: Secondary | ICD-10-CM

## 2020-02-28 MED ORDER — BUDESONIDE 0.5 MG/2ML IN SUSP: 0.5000 mg | Freq: Two times a day (BID) | RESPIRATORY_TRACT | 5 refills | Status: DC

## 2020-02-28 NOTE — Telephone Encounter (Signed)
Spoke with Caleb Mills and sent in refill of budesonide, pharmacy said insurance wanted to know last time a nebulizer was giving it looked like march 2020 and informed them of that they would run the albuterol and let us know if it didn't go thru

## 2020-02-28 NOTE — Telephone Encounter (Signed)
Pt. States pharmacy denied refill on albuterol solution and Pulmicort.

## 2020-03-02 MED ORDER — OMALIZUMAB 150 MG ~~LOC~~ SOLR
225.0000 mg | SUBCUTANEOUS | Status: DC
  Administered 2020-03-24 – 2020-11-11 (×17): 225 mg via SUBCUTANEOUS

## 2020-03-02 NOTE — Addendum Note (Signed)
Addended by: Devoria Glassing on: 03/02/2020 12:31 PM   Modules accepted: Orders

## 2020-03-05 ENCOUNTER — Other Ambulatory Visit: Payer: Self-pay

## 2020-03-05 MED ORDER — ALBUTEROL SULFATE (2.5 MG/3ML) 0.083% IN NEBU: INHALATION_SOLUTION | RESPIRATORY_TRACT | 1 refills | Status: DC

## 2020-03-06 DIAGNOSIS — J4550 Severe persistent asthma, uncomplicated: Secondary | ICD-10-CM | POA: Diagnosis not present

## 2020-03-10 ENCOUNTER — Other Ambulatory Visit: Payer: Self-pay

## 2020-03-10 ENCOUNTER — Ambulatory Visit (INDEPENDENT_AMBULATORY_CARE_PROVIDER_SITE_OTHER): Payer: Medicare Other

## 2020-03-10 DIAGNOSIS — J4550 Severe persistent asthma, uncomplicated: Secondary | ICD-10-CM | POA: Diagnosis not present

## 2020-03-10 MED ORDER — FORMOTEROL FUMARATE 20 MCG/2ML IN NEBU: INHALATION_SOLUTION | RESPIRATORY_TRACT | 1 refills | Status: DC

## 2020-03-10 NOTE — Telephone Encounter (Signed)
Pt stated he just needed a refill of performist so sent in refill for pt he would let us know if he had any issues getting this items

## 2020-03-10 NOTE — Telephone Encounter (Signed)
PT in office states pharmacy to follow up on budesonide and perforomist rx. Still not able to pick up at pharmacy. Advise, Carrie working on [place] for budesonide. Please review perforomist as last order was 10/2019

## 2020-03-20 ENCOUNTER — Other Ambulatory Visit: Payer: Self-pay

## 2020-03-20 ENCOUNTER — Other Ambulatory Visit: Payer: Self-pay | Admitting: Pediatrics

## 2020-03-20 MED ORDER — FORMOTEROL FUMARATE 20 MCG/2ML IN NEBU: 20.0000 ug | INHALATION_SOLUTION | Freq: Two times a day (BID) | RESPIRATORY_TRACT | 1 refills | Status: DC

## 2020-03-20 NOTE — Telephone Encounter (Signed)
PT says [facility] did not have this order for performist. Can you please resend it. PT is running very low and needs refill asap.

## 2020-03-20 NOTE — Telephone Encounter (Signed)
Resent performist to [facility]

## 2020-03-23 DIAGNOSIS — J4550 Severe persistent asthma, uncomplicated: Secondary | ICD-10-CM | POA: Diagnosis not present

## 2020-03-24 ENCOUNTER — Ambulatory Visit: Payer: Medicare Other | Admitting: Family Medicine

## 2020-03-24 ENCOUNTER — Encounter: Payer: Self-pay | Admitting: Family Medicine

## 2020-03-24 ENCOUNTER — Other Ambulatory Visit: Payer: Self-pay

## 2020-03-24 ENCOUNTER — Ambulatory Visit (INDEPENDENT_AMBULATORY_CARE_PROVIDER_SITE_OTHER): Payer: Medicare Other

## 2020-03-24 ENCOUNTER — Other Ambulatory Visit: Payer: Self-pay | Admitting: Pediatrics

## 2020-03-24 VITALS — BP 124/82 | HR 63 | Temp 98.5°F | Resp 16

## 2020-03-24 DIAGNOSIS — J4489 Other specified chronic obstructive pulmonary disease: Secondary | ICD-10-CM

## 2020-03-24 DIAGNOSIS — J449 Chronic obstructive pulmonary disease, unspecified: Secondary | ICD-10-CM

## 2020-03-24 DIAGNOSIS — K219 Gastro-esophageal reflux disease without esophagitis: Secondary | ICD-10-CM

## 2020-03-24 DIAGNOSIS — J4551 Severe persistent asthma with (acute) exacerbation: Secondary | ICD-10-CM

## 2020-03-24 DIAGNOSIS — H1010 Acute atopic conjunctivitis, unspecified eye: Secondary | ICD-10-CM | POA: Diagnosis not present

## 2020-03-24 DIAGNOSIS — J4550 Severe persistent asthma, uncomplicated: Secondary | ICD-10-CM | POA: Diagnosis not present

## 2020-03-24 MED ORDER — CEFDINIR 300 MG PO CAPS: 300.0000 mg | ORAL_CAPSULE | Freq: Two times a day (BID) | ORAL | 0 refills | Status: DC

## 2020-03-24 NOTE — Patient Instructions (Addendum)
Asthma Begin prednisone 10 mg tablets. Take 2 tablets twice a day for 3 days, then take 2 tablets once a day for 1 day, then take 1 tablet on the 5th day, then stop Continue montelukast 10 mg once a day to prevent cough or wheeze For now, continue budesonide 0.5 mg twice a day via nebulizer and Perforomist twice a day via nebulizer. Evaluate these medications with Dr. [name] at your upcoming appointment Continue albuterol 2 puffs every 4 hours as needed for cough or wheeze OR Instead use albuterol 0.083% solution via nebulizer one unit vial every 4 hours as needed for cough or wheeze Continue Xolair injections once every 2 weeks   Allergic rhinitis Continue Flonase 2 sprays in each nostril once a day as needed for a stuffy nose Continue azelastine 2 sprays in each nostril twice a day as needed for a runny nose Consider saline nasal rinses as needed for nasal symptoms. Use this before any medicated nasal sprays for best result Continue Claritin 10 mg once a day as needed for a runny nose For thick nasal secretions, begin Mucinex 1200 mg twice a day and increase hydration as possible  Cough/bacterial exacerbation Begin cefdinir 300 mg twice a day for the next 10 days, then stop Continue the treatment plans as listed above  Allergic conjunctivitis Continue a lubricating eye drop Some over the counter eye drops include Pataday one drop in each eye once a day as needed for red, itchy eyes OR Zaditor one drop in each eye twice a day as needed for red itchy eyes.  Reflux Continue dietary and lifestyle modifications as listed below  Call the clinic if this treatment plan is not working well for you  Follow up in 1 month or sooner if needed.

## 2020-03-24 NOTE — Progress Notes (Addendum)
[address] [place] [address] Dept: [phone]  FOLLOW UP NOTE  Patient ID: Zayn Selley, male    DOB: June 13, 1951  Age: 69 y.o. MRN: [id number] Date of Office Visit: 03/24/2020  Assessment  Chief Complaint: Asthma  HPI Cahlil Sattar is a 68 year old male who presents to the clinic for follow-up visit.  He was last seen in this clinic on 12/31/2019 by Thermon Leyland, FNP, for evaluation of asthma, allergic rhinitis, allergic conjunctivitis, and reflux.  In the interim he reports that he was diagnosed with Covid on March 01, 2020 with symptoms including fever, shortness of breath and cough producing green mucus. He was subsequently treated with azithromycin, prednisone, and cefdinir. Due to a mechanical heart valve chronic Coumadin anticoagulation he reports he was advised not to receive any Covid vaccines. At today's visit, he reports that he continues to experience symptoms including cough producing clear to light yellow mucus and chest congestion for which he began taking cefdinir 300 mg once a day that was left over from a previous illness and prednisone 5 mg once a day that he reports is the end of a prednisone taper that he received from his cardiologist. At today's visit, he reports his asthma has been poorly controlled with symptoms including shortness of breath with activity and rest, intermittent wheeze, and cough producing clear to yellow phlegm. He reports that over the last 2 weeks he has stopped using Anoro and has returned to budesonide 0.5 mg twice a day via nebulizer, Perforomist twice a day via nebulizer, and albuterol as needed. He reports that he stopped Anoro due to deeper cough and he will evaluate this with Dr. [name] as at his follow-up appointment next week. He continues receiving Xolair injections once every 2 weeks and reports that, before he was infected with Covid, he had started to notice an improvement in his symptoms of asthma while continuing on Xolair injections.  Allergic rhinitis is reported as not well controlled with thick nasal drainage for which he continues Flonase, azelastine, and saline nasal rinses daily. Allergic conjunctivitis is reported as well controlled with Pataday. Reflux is reported as well controlled with dietary measures including decreasing dairy and coffee intake. His current medications are listed in the chart.   Drug Allergies:  Allergies  Allergen Reactions  . Augmentin [Amoxicillin-Pot Clavulanate] Nausea Only  . Ciprofloxacin Other (See Comments)    Joint pain    Physical Exam: BP 124/82   Pulse 63   Temp 98.5 F (36.9 C) (Oral)   Resp 16   SpO2 98%    Physical Exam Vitals reviewed.  Constitutional:      Appearance: Normal appearance.  HENT:     Head: Normocephalic and atraumatic.     Right Ear: Tympanic membrane normal.     Left Ear: Tympanic membrane normal.     Nose:     Comments: Bilateral nares slightly erythematous with thick clear nasal drainage noted. Pharynx normal. Ears normal. Eyes normal.    Mouth/Throat:     Pharynx: Oropharynx is clear.  Eyes:     Conjunctiva/sclera: Conjunctivae normal.  Cardiovascular:     Rate and Rhythm: Normal rate and regular rhythm.     Heart sounds: Normal heart sounds. No murmur heard.   Pulmonary:     Effort: Pulmonary effort is normal.     Comments: Bilateral slight expiratory wheeze noted. Musculoskeletal:        General: Normal range of motion.     Cervical back: Normal range of  motion and neck supple.  Skin:    General: Skin is warm and dry.  Neurological:     Mental Status: He is alert and oriented to person, place, and time.  Psychiatric:        Mood and Affect: Mood normal.        Behavior: Behavior normal.        Thought Content: Thought content normal.        Judgment: Judgment normal.    Diagnostics: FVC 3.04, FEV1 1.14. Predicted FVC 3.45, predicted FEV1 2.51. Spirometry indicates severe airway obstruction. This is consistent with previous  spirometry readings.  Assessment and Plan: 1. Severe persistent asthma with acute exacerbation   2. Asthma-COPD overlap syndrome (HCC)   3. Seasonal allergic conjunctivitis   4. Gastroesophageal reflux disease, unspecified whether esophagitis present     Meds ordered this encounter  Medications  . cefdinir (OMNICEF) 300 MG capsule    Sig: Take 1 capsule (300 mg total) by mouth 2 (two) times daily.    Dispense:  20 capsule    Refill:  0    Patient Instructions  Asthma Begin prednisone 10 mg tablets. Take 2 tablets twice a day for 3 days, then take 2 tablets once a day for 1 day, then take 1 tablet on the 5th day, then stop Continue montelukast 10 mg once a day to prevent cough or wheeze For now, continue budesonide 0.5 mg twice a day via nebulizer and Perforomist twice a day via nebulizer. Evaluate these medications with Dr. [name] at your upcoming appointment Continue albuterol 2 puffs every 4 hours as needed for cough or wheeze OR Instead use albuterol 0.083% solution via nebulizer one unit vial every 4 hours as needed for cough or wheeze Continue Xolair injections once every 2 weeks   Allergic rhinitis Continue Flonase 2 sprays in each nostril once a day as needed for a stuffy nose Continue azelastine 2 sprays in each nostril twice a day as needed for a runny nose Consider saline nasal rinses as needed for nasal symptoms. Use this before any medicated nasal sprays for best result Continue Claritin 10 mg once a day as needed for a runny nose For thick nasal secretions, begin Mucinex 1200 mg twice a day and increase hydration as possible  Cough/bacterial exacerbation Begin cefdinir 300 mg twice a day for the next 10 days, then stop Continue the treatment plans as listed above  Allergic conjunctivitis Continue a lubricating eye drop Some over the counter eye drops include Pataday one drop in each eye once a day as needed for red, itchy eyes OR Zaditor one drop in each eye twice a  day as needed for red itchy eyes.  Reflux Continue dietary and lifestyle modifications as listed below  Call the clinic if this treatment plan is not working well for you  Follow up in 1 month or sooner if needed.    Return in about 4 weeks (around 04/21/2020), or if symptoms worsen or fail to improve.    Thank you for the opportunity to care for this patient.  Please do not hesitate to contact me with questions.  Thermon Leyland, FNP [facility]  ________________________________________________  I have provided oversight concerning [facility]'s evaluation and treatment of this patient's health issues addressed during today's encounter.  I agree with the assessment and therapeutic plan as outlined in the note.   Signed,   R Jorene Guest, MD

## 2020-04-06 DIAGNOSIS — J4550 Severe persistent asthma, uncomplicated: Secondary | ICD-10-CM | POA: Diagnosis not present

## 2020-04-07 ENCOUNTER — Ambulatory Visit (INDEPENDENT_AMBULATORY_CARE_PROVIDER_SITE_OTHER): Payer: Medicare Other

## 2020-04-07 ENCOUNTER — Other Ambulatory Visit: Payer: Self-pay

## 2020-04-07 DIAGNOSIS — J4550 Severe persistent asthma, uncomplicated: Secondary | ICD-10-CM

## 2020-04-14 ENCOUNTER — Other Ambulatory Visit: Payer: Self-pay | Admitting: Pediatrics

## 2020-04-20 ENCOUNTER — Ambulatory Visit (INDEPENDENT_AMBULATORY_CARE_PROVIDER_SITE_OTHER): Payer: Medicare Other | Admitting: Family Medicine

## 2020-04-20 ENCOUNTER — Encounter: Payer: Self-pay | Admitting: Family Medicine

## 2020-04-20 ENCOUNTER — Other Ambulatory Visit: Payer: Self-pay

## 2020-04-20 VITALS — BP 120/78 | HR 77 | Temp 97.4°F | Resp 18

## 2020-04-20 DIAGNOSIS — J4550 Severe persistent asthma, uncomplicated: Secondary | ICD-10-CM

## 2020-04-20 DIAGNOSIS — J3089 Other allergic rhinitis: Secondary | ICD-10-CM

## 2020-04-20 DIAGNOSIS — K219 Gastro-esophageal reflux disease without esophagitis: Secondary | ICD-10-CM

## 2020-04-20 DIAGNOSIS — H1010 Acute atopic conjunctivitis, unspecified eye: Secondary | ICD-10-CM | POA: Diagnosis not present

## 2020-04-20 DIAGNOSIS — Z952 Presence of prosthetic heart valve: Secondary | ICD-10-CM

## 2020-04-20 DIAGNOSIS — Z7901 Long term (current) use of anticoagulants: Secondary | ICD-10-CM | POA: Insufficient documentation

## 2020-04-20 DIAGNOSIS — J449 Chronic obstructive pulmonary disease, unspecified: Secondary | ICD-10-CM

## 2020-04-20 MED ORDER — YUPELRI 175 MCG/3ML IN SOLN: 175.0000 ug | Freq: Every day | RESPIRATORY_TRACT | 3 refills | Status: DC

## 2020-04-20 NOTE — Progress Notes (Addendum)
[address] [place] [address] Dept: [phone]  FOLLOW UP NOTE  Patient ID: Caleb Mills, male    DOB: [birth year]  Age: 69 y.o. MRN: [id number] Date of Office Visit: 04/20/2020  Assessment  Chief Complaint: Asthma and Shortness of Breath  HPI Caleb Mills is a 69 year old male who presents to the clinic for follow-up visit.  He was last seen in this clinic on 03/24/2020 for evaluation of asthma, allergic rhinitis, allergic conjunctivitis, reflux, and cough.  At that time he was prescribed cefdinir and prednisone with relief of cough symptoms.  He reports his asthma as severe with symptoms including shortness of breath with any activity, decrease cough, and decreased wheeze.  He reports that he is unable to do any activity for greater than 2 minutes without stopping to use albuterol to decrease shortness of breath.  He reports this has been occurring since he had COVID-19 in August 2021.  He continues budesonide 0.5 mg twice a day and Perforomist twice a day via nebulizer.  He reports that he still uses albuterol frequently.  He continues Xolair 225 mg injections once every 2 weeks.  Allergic rhinitis is reported as doing much better with occasional nasal congestion and rhinorrhea for which he uses Flonase, azelastine, and saline nasal rinses as needed.  Allergic conjunctivitis is reported as well controlled with Pataday as needed.  Reflux is reported as well controlled while continuing dietary modifications and limiting coffee intake.  His current medications are listed in the chart.   Drug Allergies:  Allergies  Allergen Reactions  . Augmentin [Amoxicillin-Pot Clavulanate] Nausea Only  . Ciprofloxacin Other (See Comments)    Joint pain  . Azelastine Other (See Comments)    Dry eyes    Physical Exam: BP 120/78   Pulse 77   Temp (!) 97.4 F (36.3 C) (Tympanic)   Resp 18   SpO2 98%    Physical Exam Vitals reviewed.  Constitutional:      Appearance: Normal appearance. He is  well-developed.  HENT:     Head: Normocephalic and atraumatic.     Right Ear: Tympanic membrane normal.     Left Ear: Tympanic membrane normal.     Nose:     Comments: Bilateral nares normal.  Septal deviation noted.  Pharynx normal.  Ears normal.  Eyes normal.    Mouth/Throat:     Pharynx: Oropharynx is clear.  Eyes:     Conjunctiva/sclera: Conjunctivae normal.  Cardiovascular:     Rate and Rhythm: Normal rate and regular rhythm.     Heart sounds: Normal heart sounds. No murmur heard.   Pulmonary:     Effort: Pulmonary effort is normal.     Breath sounds: Normal breath sounds.     Comments: Lungs clear to auscultation Musculoskeletal:        General: Normal range of motion.     Cervical back: Normal range of motion and neck supple.  Skin:    General: Skin is warm and dry.  Neurological:     Mental Status: He is alert and oriented to person, place, and time.  Psychiatric:        Mood and Affect: Mood normal.        Behavior: Behavior normal.        Thought Content: Thought content normal.        Judgment: Judgment normal.     Diagnostics: FVC 2.93, FEV1 1.14.  Predicted FVC 3.57, predicted FEV1 2.60.  Spirometry indicates severe airway obstruction.  This is consistent with previous spirometry readings.  Assessment and Plan: 1. Severe persistent asthma, unspecified whether complicated   2. Other allergic rhinitis   3. Gastroesophageal reflux disease, unspecified whether esophagitis present   4. Seasonal allergic conjunctivitis   5. History of aortic valve replacement   6. Prophylactic use of warfarin for venous thromboembolism   7. Asthma-COPD overlap syndrome [facility])     Patient Instructions  Asthma A prescription has been provided for prednisone 10 mg/day for the next 14 days. Start Yupileri once daily via nebulizer. Continue montelukast 10 mg once a day to prevent cough or wheeze For now, continue budesonide 0.5 mg twice a day via nebulizer and Perforomist twice a  day via nebulizer. Evaluate these medications with Dr. [name] at your upcoming appointment Continue albuterol 2 puffs every 4 hours as needed for cough or wheeze OR Instead use albuterol 0.083% solution via nebulizer one unit vial every 4 hours as needed for cough or wheeze Continue Xolair injections once every 2 weeks  The patient is to contact me if symptoms do not improve, persist, or progress  Allergic rhinitis Continue Flonase 2 sprays in each nostril once a day as needed for a stuffy nose Continue azelastine 2 sprays in each nostril twice a day as needed for a runny nose Consider saline nasal rinses as needed for nasal symptoms. Use this before any medicated nasal sprays for best result Continue Claritin 10 mg once a day as needed for a runny nose For thick nasal secretions, begin Mucinex 1200 mg twice a day and increase hydration as possible  Allergic conjunctivitis Continue a lubricating eye drop Some over the counter eye drops include Pataday one drop in each eye once a day as needed for red, itchy eyes OR Zaditor one drop in each eye twice a day as needed for red itchy eyes.  Reflux Continue dietary and lifestyle modifications as listed below  Call the clinic if this treatment plan is not working well for you  Follow up in 6 weeks or sooner if needed.   Return in about 6 weeks (around 06/01/2020), or if symptoms worsen or fail to improve.    Thank you for the opportunity to care for this patient.  Please do not hesitate to contact me with questions.  Thermon Leyland, FNP [facility]

## 2020-04-20 NOTE — Progress Notes (Deleted)
° °[address] °[place] [address] °Dept: [phone] ° °FOLLOW UP NOTE ° °Patient ID: Caleb Mills, male    DOB: 07/07/1950  Age: 69 y.o. MRN: [id number] °Date of Office Visit: 04/20/2020 ° °Assessment  °Chief Complaint: Asthma and Shortness of Breath ° °HPI °Caleb Mills is a 69-year-old male who presents to the clinic for follow-up visit.  He was last seen in this clinic on 03/24/2020 for evaluation of asthma, allergic rhinitis, allergic conjunctivitis, reflux, and cough.  At that time he was prescribed cefdinir and prednisone with relief of cough symptoms.  He reports his asthma as severe with symptoms including shortness of breath with any activity, decrease cough, and decreased wheeze.  He reports that he is unable to do any activity for greater than 2 minutes without stopping to use albuterol to decrease shortness of breath.  He reports this has been occurring since he had COVID-19 in August 2021.  He continues budesonide 0.5 mg twice a day and Perforomist twice a day via nebulizer.  He reports that he still uses albuterol frequently.  He continues Xolair 225 mg injections once every 2 weeks.  Allergic rhinitis is reported as doing much better with occasional nasal congestion and rhinorrhea for which he uses Flonase, azelastine, and saline nasal rinses as needed.  Allergic conjunctivitis is reported as well controlled with Pataday as needed.  Reflux is reported as well controlled while continuing dietary modifications and limiting coffee intake.  His current medications are listed in the chart. ° ° °Drug Allergies:  °Allergies  °Allergen Reactions  °• Augmentin [Amoxicillin-Pot Clavulanate] Nausea Only  °• Ciprofloxacin Other (See Comments)  °  Joint pain  °• Azelastine Other (See Comments)  °  Dry eyes  ° ° °Physical Exam: °BP 120/78    Pulse 77    Temp (!) 97.4 °F (36.3 °C) (Tympanic)    Resp 18    SpO2 98%   ° °Physical Exam °Vitals reviewed.  °Constitutional:   °   Appearance: Normal appearance. He is  well-developed.  °HENT:  °   Head: Normocephalic and atraumatic.  °   Right Ear: Tympanic membrane normal.  °   Left Ear: Tympanic membrane normal.  °   Nose:  °   Comments: Bilateral nares normal.  Septal deviation noted.  Pharynx normal.  Ears normal.  Eyes normal. °   Mouth/Throat:  °   Pharynx: Oropharynx is clear.  °Eyes:  °   Conjunctiva/sclera: Conjunctivae normal.  °Cardiovascular:  °   Rate and Rhythm: Normal rate and regular rhythm.  °   Heart sounds: Normal heart sounds. No murmur heard.  ° °Pulmonary:  °   Effort: Pulmonary effort is normal.  °   Breath sounds: Normal breath sounds.  °   Comments: Lungs clear to auscultation °Musculoskeletal:     °   General: Normal range of motion.  °   Cervical back: Normal range of motion and neck supple.  °Skin: °   General: Skin is warm and dry.  °Neurological:  °   Mental Status: He is alert and oriented to person, place, and time.  °Psychiatric:     °   Mood and Affect: Mood normal.     °   Behavior: Behavior normal.     °   Thought Content: Thought content normal.     °   Judgment: Judgment normal.  ° ° ° °Diagnostics: °FVC 2.93, FEV1 1.14.  Predicted FVC 3.57, predicted FEV1 2.60.  Spirometry indicates   severe airway obstruction.  This is consistent with previous spirometry readings. ° °Assessment and Plan: °1. Severe persistent asthma, unspecified whether complicated   °2. Other allergic rhinitis   °3. Gastroesophageal reflux disease, unspecified whether esophagitis present   °4. Seasonal allergic conjunctivitis   °5. History of aortic valve replacement   °6. Prophylactic use of warfarin for venous thromboembolism   °7. Asthma-COPD overlap syndrome (HCC)   ° ° °Patient Instructions  °Asthma °Continue montelukast 10 mg once a day to prevent cough or wheeze °For now, continue budesonide 0.5 mg twice a day via nebulizer and Perforomist twice a day via nebulizer. Evaluate these medications with Dr. Ejaz at your upcoming appointment °Continue albuterol 2 puffs every  4 hours as needed for cough or wheeze OR Instead use albuterol 0.083% solution via nebulizer one unit vial every 4 hours as needed for cough or wheeze °Continue Xolair injections once every 2 weeks  ° °Allergic rhinitis °Continue Flonase 2 sprays in each nostril once a day as needed for a stuffy nose °Continue azelastine 2 sprays in each nostril twice a day as needed for a runny nose °Consider saline nasal rinses as needed for nasal symptoms. Use this before any medicated nasal sprays for best result °Continue Claritin 10 mg once a day as needed for a runny nose °For thick nasal secretions, begin Mucinex 1200 mg twice a day and increase hydration as possible ° °Allergic conjunctivitis °Continue a lubricating eye drop °Some over the counter eye drops include Pataday one drop in each eye once a day as needed for red, itchy eyes OR Zaditor one drop in each eye twice a day as needed for red itchy eyes. ° °Reflux °Continue dietary and lifestyle modifications as listed below ° °Call the clinic if this treatment plan is not working well for you ° °Follow up in 6 weeks or sooner if needed. ° ° °Return in about 6 weeks (around 06/01/2020), or if symptoms worsen or fail to improve. °  ° °Thank you for the opportunity to care for this patient.  Please do not hesitate to contact me with questions. ° °Sadey Yandell, FNP °[facility] ° ° ° ° ° °

## 2020-04-20 NOTE — Addendum Note (Signed)
Addended by: Hetty Blend on: 04/20/2020 04:37 PM   Modules accepted: Orders

## 2020-04-20 NOTE — Patient Instructions (Addendum)
Asthma Continue montelukast 10 mg once a day to prevent cough or wheeze For now, continue budesonide 0.5 mg twice a day via nebulizer and Perforomist twice a day via nebulizer. Evaluate these medications with Dr. [name] at your upcoming appointment Continue albuterol 2 puffs every 4 hours as needed for cough or wheeze OR Instead use albuterol 0.083% solution via nebulizer one unit vial every 4 hours as needed for cough or wheeze Continue Xolair injections once every 2 weeks   Allergic rhinitis Continue Flonase 2 sprays in each nostril once a day as needed for a stuffy nose Continue azelastine 2 sprays in each nostril twice a day as needed for a runny nose Consider saline nasal rinses as needed for nasal symptoms. Use this before any medicated nasal sprays for best result Continue Claritin 10 mg once a day as needed for a runny nose For thick nasal secretions, begin Mucinex 1200 mg twice a day and increase hydration as possible  Allergic conjunctivitis Continue a lubricating eye drop Some over the counter eye drops include Pataday one drop in each eye once a day as needed for red, itchy eyes OR Zaditor one drop in each eye twice a day as needed for red itchy eyes.  Reflux Continue dietary and lifestyle modifications as listed below  Call the clinic if this treatment plan is not working well for you  Follow up in 6 weeks or sooner if needed.

## 2020-04-20 NOTE — Addendum Note (Signed)
Addended by: Darrold Junker D on: 04/20/2020 04:27 PM   Modules accepted: Orders

## 2020-04-21 ENCOUNTER — Ambulatory Visit (INDEPENDENT_AMBULATORY_CARE_PROVIDER_SITE_OTHER): Payer: Medicare Other

## 2020-04-21 DIAGNOSIS — J4550 Severe persistent asthma, uncomplicated: Secondary | ICD-10-CM | POA: Diagnosis not present

## 2020-04-21 NOTE — Progress Notes (Deleted)
[address] [place] [address] Dept: [phone]  FOLLOW UP NOTE  Patient ID: Caleb Mills, male    DOB: [birth year]  Age: 69 y.o. MRN: [id number] Date of Office Visit: 04/20/2020  Assessment  Chief Complaint: Asthma and Shortness of Breath  HPI Caleb Mills is a 69 year old male who presents to the clinic for follow-up visit.  He was last seen in this clinic on 03/24/2020 for evaluation of asthma, allergic rhinitis, allergic conjunctivitis, reflux, and cough.  At that time he was prescribed cefdinir and prednisone with relief of cough symptoms.  He reports his asthma as severe with symptoms including shortness of breath with any activity, decrease cough, and decreased wheeze.  He reports that he is unable to do any activity for greater than 2 minutes without stopping to use albuterol to decrease shortness of breath.  He reports this has been occurring since he had COVID-19 in August 2021.  He continues budesonide 0.5 mg twice a day and Perforomist twice a day via nebulizer.  He reports that he still uses albuterol frequently.  He continues Xolair 225 mg injections once every 2 weeks.  Allergic rhinitis is reported as doing much better with occasional nasal congestion and rhinorrhea for which he uses Flonase, azelastine, and saline nasal rinses as needed.  Allergic conjunctivitis is reported as well controlled with Pataday as needed.  Reflux is reported as well controlled while continuing dietary modifications and limiting coffee intake.  His current medications are listed in the chart.   Drug Allergies:  Allergies  Allergen Reactions   Augmentin [Amoxicillin-Pot Clavulanate] Nausea Only   Ciprofloxacin Other (See Comments)    Joint pain   Azelastine Other (See Comments)    Dry eyes    Physical Exam: BP 120/78    Pulse 77    Temp (!) 97.4 F (36.3 C) (Tympanic)    Resp 18    SpO2 98%    Physical Exam Vitals reviewed.  Constitutional:      Appearance: Normal appearance. He is  well-developed.  HENT:     Head: Normocephalic and atraumatic.     Right Ear: Tympanic membrane normal.     Left Ear: Tympanic membrane normal.     Nose:     Comments: Bilateral nares normal.  Septal deviation noted.  Pharynx normal.  Ears normal.  Eyes normal.    Mouth/Throat:     Pharynx: Oropharynx is clear.  Eyes:     Conjunctiva/sclera: Conjunctivae normal.  Cardiovascular:     Rate and Rhythm: Normal rate and regular rhythm.     Heart sounds: Normal heart sounds. No murmur heard.   Pulmonary:     Effort: Pulmonary effort is normal.     Breath sounds: Normal breath sounds.     Comments: Lungs clear to auscultation Musculoskeletal:        General: Normal range of motion.     Cervical back: Normal range of motion and neck supple.  Skin:    General: Skin is warm and dry.  Neurological:     Mental Status: He is alert and oriented to person, place, and time.  Psychiatric:        Mood and Affect: Mood normal.        Behavior: Behavior normal.        Thought Content: Thought content normal.        Judgment: Judgment normal.     Diagnostics: FVC 2.93, FEV1 1.14.  Predicted FVC 3.57, predicted FEV1 2.60.  Spirometry indicates  severe airway obstruction.  This is consistent with previous spirometry readings.  Assessment and Plan: 1. Severe persistent asthma, unspecified whether complicated   2. Other allergic rhinitis   3. Gastroesophageal reflux disease, unspecified whether esophagitis present   4. Seasonal allergic conjunctivitis   5. History of aortic valve replacement   6. Prophylactic use of warfarin for venous thromboembolism   7. 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 Evaluate these medications with Dr. [name] at your upcoming appointment Continue albuterol 2 puffs every  4 hours as needed for cough or wheeze OR Instead use albuterol 0.083% solution via nebulizer one unit vial every 4 hours as needed for cough or wheeze Continue Xolair injections once every 2 weeks   Allergic rhinitis Continue Flonase 2 sprays in each nostril once a day as needed for a stuffy nose Continue azelastine 2 sprays in each nostril twice a day as needed for a runny nose Consider saline nasal rinses as needed for nasal symptoms. Use this before any medicated nasal sprays for best result Continue Claritin 10 mg once a day as needed for a runny nose For thick nasal secretions, begin Mucinex 1200 mg twice a day and increase hydration as possible  Allergic conjunctivitis Continue a lubricating eye drop Some over the counter eye drops include Pataday one drop in each eye once a day as needed for red, itchy eyes OR Zaditor one drop in each eye twice a day as needed for red itchy eyes.  Reflux Continue dietary and lifestyle modifications as listed below  Call the clinic if this treatment plan is not working well for you  Follow up in 6 weeks or sooner if needed.   Return in about 6 weeks (around 06/01/2020), or if symptoms worsen or fail to improve.    Thank you for the opportunity to care for this patient.  Please do not hesitate to contact me with questions.  Thermon Leyland, FNP [facility]

## 2020-04-22 ENCOUNTER — Telehealth: Payer: Self-pay | Admitting: Family Medicine

## 2020-04-22 NOTE — Telephone Encounter (Signed)
Patient reports his breathing has not changed since his appointment on Monday. His pharmacy did not have [name] in stock, however, they estimate it's arrival tomorrow. I am also expecting a delivery of [name] samples to the [place] office. I will call Caleb Mills when these arrive. He reports that his INR has become variable since he last took prednisone and is not interested in taking a low dose daily prednisone at this time. He is going to visit his cardiologist next week where he will have his INR rechecked. He reports if this has normalized, he will consider a daily prednisone dose. He did not have any further questions.

## 2020-04-29 ENCOUNTER — Telehealth: Payer: Self-pay

## 2020-04-29 NOTE — Telephone Encounter (Signed)
Ambs, Norvel Richards, FNP  [facility] [place] [facility] Can you please call this patient and make sure he received [name] from his pharmacy? And please ask how he is breathing? Thank you   Pt states he has the yupelri and using it but had a heart rate increase so backed off some and going to try again. Not much has improved with his breathing.

## 2020-04-29 NOTE — Telephone Encounter (Signed)
Thank you :)

## 2020-05-04 DIAGNOSIS — J4550 Severe persistent asthma, uncomplicated: Secondary | ICD-10-CM | POA: Diagnosis not present

## 2020-05-05 ENCOUNTER — Ambulatory Visit (INDEPENDENT_AMBULATORY_CARE_PROVIDER_SITE_OTHER): Payer: Medicare Other

## 2020-05-05 ENCOUNTER — Other Ambulatory Visit: Payer: Self-pay

## 2020-05-05 DIAGNOSIS — J4550 Severe persistent asthma, uncomplicated: Secondary | ICD-10-CM

## 2020-05-15 DIAGNOSIS — J4550 Severe persistent asthma, uncomplicated: Secondary | ICD-10-CM | POA: Diagnosis not present

## 2020-05-18 ENCOUNTER — Other Ambulatory Visit: Payer: Self-pay

## 2020-05-18 ENCOUNTER — Ambulatory Visit (INDEPENDENT_AMBULATORY_CARE_PROVIDER_SITE_OTHER): Payer: Medicare Other

## 2020-05-18 DIAGNOSIS — J4550 Severe persistent asthma, uncomplicated: Secondary | ICD-10-CM | POA: Diagnosis not present

## 2020-05-21 ENCOUNTER — Other Ambulatory Visit: Payer: Self-pay | Admitting: Family Medicine

## 2020-05-21 NOTE — Telephone Encounter (Signed)
Pt request refill for perforomist

## 2020-05-25 ENCOUNTER — Telehealth: Payer: Self-pay | Admitting: Family Medicine

## 2020-05-25 NOTE — Telephone Encounter (Signed)
Patient tried filling his Performomist and was told Medicare needed a Prior Authorization for this. He is running low. [facility] on [address].

## 2020-05-25 NOTE — Telephone Encounter (Signed)
Spoke to pt. He said he was unable to get his performance filled. I called his pharmacy.  Medicare has to send a [place] C[place] medicare form to our office for [place] to fill out and send in.

## 2020-05-26 DIAGNOSIS — J4550 Severe persistent asthma, uncomplicated: Secondary | ICD-10-CM

## 2020-05-27 NOTE — Telephone Encounter (Signed)
Let pt. Know that I fax'd the CMA medicare form. They said once fax'd in the [facility] will notify pt. In a couple of days once medication is ready for pick up.

## 2020-06-01 ENCOUNTER — Ambulatory Visit (INDEPENDENT_AMBULATORY_CARE_PROVIDER_SITE_OTHER): Payer: Medicare Other

## 2020-06-01 ENCOUNTER — Ambulatory Visit: Payer: Medicare Other | Admitting: Family Medicine

## 2020-06-01 ENCOUNTER — Other Ambulatory Visit: Payer: Self-pay

## 2020-06-01 ENCOUNTER — Encounter: Payer: Self-pay | Admitting: Family Medicine

## 2020-06-01 VITALS — BP 126/76 | HR 76 | Temp 98.1°F | Resp 18

## 2020-06-01 DIAGNOSIS — J4550 Severe persistent asthma, uncomplicated: Secondary | ICD-10-CM

## 2020-06-01 DIAGNOSIS — Z7901 Long term (current) use of anticoagulants: Secondary | ICD-10-CM | POA: Diagnosis not present

## 2020-06-01 DIAGNOSIS — J3089 Other allergic rhinitis: Secondary | ICD-10-CM

## 2020-06-01 DIAGNOSIS — H1010 Acute atopic conjunctivitis, unspecified eye: Secondary | ICD-10-CM

## 2020-06-01 DIAGNOSIS — Z952 Presence of prosthetic heart valve: Secondary | ICD-10-CM

## 2020-06-01 DIAGNOSIS — K219 Gastro-esophageal reflux disease without esophagitis: Secondary | ICD-10-CM | POA: Diagnosis not present

## 2020-06-01 DIAGNOSIS — J449 Chronic obstructive pulmonary disease, unspecified: Secondary | ICD-10-CM

## 2020-06-01 NOTE — Progress Notes (Addendum)
[address] [place] [address] Dept: [phone]  FOLLOW UP NOTE  Patient ID: Caleb Mills, male    DOB: 28-May-1951  Age: 69 y.o. MRN: [id number] Date of Office Visit: 06/01/2020  Assessment  Chief Complaint: Asthma  HPI Caleb Mills is a 69 year old male who presents to the clinic for follow-up visit.  He was last seen in this clinic on 04/20/2020 for evaluation of asthma, allergic rhinitis, allergic conjunctivitis, reflux, cough, history of Covid August 2021, COPD, and mechanical heart valve chronic warfarin.  At today's visit, he reports his asthma has been moderately well controlled with shortness of breath occurring frequently which is worse with activity.  He denies wheeze and cough with activity and rest.  He reports that he is currently using [name] 1 time a day, Perforomist and budesonide twice a day montelukast 10 mg once a day, and albuterol between 2-7 times per day.  He continues Xolair once every 2 weeks with no local reaction.  He reports his asthma has been much better controlled while continuing on Xolair.  He has in the past tried Anoro reports that he stopped this due to side effects including sore throat, tachycardia lasting for 10 minutes, increased blood pressure, and headache.  He has a prescription for low-dose prednisone 5 mg that he can take once a day, however, is concerned about fluctuating INR values and Coumadin intake.  He will discuss close monitoring with his primary care provider with whom he has an appointment next week.  Allergic rhinitis is reported as moderately well controlled with occasional clear rhinorrhea and occasional nasal congestion.  He reports with recent weather changes he has had an increase in postnasal drainage and sneezing for the last week.  He continues saline nasal rinse, Flonase as needed, and Claritin 10 mg once a day.  Allergic conjunctivitis is reported as much improved since switching from Fasenra to Xolair and he has not needed to  use allergy eyedrops at this time.  Reflux is reported as fairly well controlled with dietary and lifestyle modifications.  He follows [facility] Jairo Ben at [facility] for gastrointestinal issues. Of note, he did have COVID in August, 2021. He reports that he has been advised against receiving COVID vaccines due to a mechanical heart valve for which he takes chronic coumadin. His current medications are listed in the chart.    Drug Allergies:  Allergies  Allergen Reactions  . Augmentin [Amoxicillin-Pot Clavulanate] Nausea Only  . Ciprofloxacin Other (See Comments)    Joint pain  . Azelastine Other (See Comments)    Dry eyes    Physical Exam: BP 126/76   Pulse 76   Temp 98.1 F (36.7 C) (Tympanic)   Resp 18   SpO2 98%    Physical Exam Vitals reviewed.  Constitutional:      Appearance: Normal appearance.  HENT:     Head: Normocephalic and atraumatic.     Right Ear: Tympanic membrane normal.     Left Ear: Tympanic membrane normal.     Nose:     Comments: Bilateral nares slightly erythematous with clear nasal drainage noted.  Nasal septal deviation noted.  Pharynx normal.  Ears normal.  Eyes normal.    Mouth/Throat:     Pharynx: Oropharynx is clear.  Eyes:     Conjunctiva/sclera: Conjunctivae normal.  Cardiovascular:     Rate and Rhythm: Normal rate and regular rhythm.     Heart sounds: Normal heart sounds. No murmur heard.   Pulmonary:  Effort: Pulmonary effort is normal.     Breath sounds: Normal breath sounds.     Comments: Lungs clear to auscultation Musculoskeletal:        General: Normal range of motion.     Cervical back: Normal range of motion and neck supple.  Skin:    General: Skin is warm and dry.  Neurological:     Mental Status: He is alert and oriented to person, place, and time.  Psychiatric:        Mood and Affect: Mood normal.        Behavior: Behavior normal.        Thought Content: Thought content normal.        Judgment: Judgment normal.     Diagnostics: FVC 2.91, FEV1 1.08.  Predicted FVC 3.57, predicted FEV1 2.60.  Spirometry indicates severe airway obstruction.  This is consistent with previous spirometry readings.  Assessment and Plan: 1. Severe persistent asthma, unspecified whether complicated   2. Other allergic rhinitis   3. Gastroesophageal reflux disease, unspecified whether esophagitis present   4. Prophylactic use of warfarin for venous thromboembolism   5. History of aortic valve replacement   6. Asthma-COPD overlap syndrome (HCC)   7. Seasonal allergic conjunctivitis     Patient Instructions  Asthma COPD overlap syndrome Continue montelukast 10 mg once a day to prevent cough or wheeze For now, continue budesonide 0.5 mg twice a day via nebulizer and Perforomist twice a day via nebulizer. Evaluate these medications with Dr. [name] at your upcoming appointment Continue albuterol 2 puffs every 4 hours as needed for cough or wheeze OR Instead use albuterol 0.083% solution via nebulizer one unit vial every 4 hours as needed for cough or wheeze Consider a low dose prednisone 5 mg tablet once a day to control asthma symptoms. Evaluate this with your primary care provider in order to maintain control of INR and coumadin changes.   Continue Xolair injections once every 2 weeks   Allergic rhinitis Continue Flonase 2 sprays in each nostril once a day as needed for a stuffy nose Continue azelastine 2 sprays in each nostril twice a day as needed for a runny nose Consider saline nasal rinses as needed for nasal symptoms. Use this before any medicated nasal sprays for best result Continue Claritin 10 mg once a day as needed for a runny nose For thick nasal secretions, begin Mucinex 1200 mg twice a day and increase hydration as possible  Allergic conjunctivitis Continue a lubricating eye drop Some over the counter eye drops include Pataday one drop in each eye once a day as needed for red, itchy eyes OR Zaditor one drop  in each eye twice a day as needed for red itchy eyes.  Reflux Continue dietary and lifestyle modifications as listed below  Call the clinic if this treatment plan is not working well for you  Follow up in 2 months or sooner if needed.   Return in about 2 months (around 08/01/2020), or if symptoms worsen or fail to improve.    Thank you for the opportunity to care for this patient.  Please do not hesitate to contact me with questions.  Thermon Leyland, FNP [facility]  ________________________________________________  I have provided oversight concerning [facility]'s evaluation and treatment of this patient's health issues addressed during today's encounter.  I agree with the assessment and therapeutic plan as outlined in the note.   Signed,   R Jorene Guest, MD

## 2020-06-01 NOTE — Patient Instructions (Addendum)
Asthma COPD overlap syndrome Continue montelukast 10 mg once a day to prevent cough or wheeze For now, continue budesonide 0.5 mg twice a day via nebulizer and Perforomist twice a day via nebulizer. Evaluate these medications with Dr. [name] at your upcoming appointment Continue albuterol 2 puffs every 4 hours as needed for cough or wheeze OR Instead use albuterol 0.083% solution via nebulizer one unit vial every 4 hours as needed for cough or wheeze Consider a low dose prednisone 5 mg tablet once a day to control asthma symptoms. Evaluate this with your primary care provider in order to maintain control of INR and coumadin changes.   Continue Xolair injections once every 2 weeks   Allergic rhinitis Continue Flonase 2 sprays in each nostril once a day as needed for a stuffy nose Continue azelastine 2 sprays in each nostril twice a day as needed for a runny nose Consider saline nasal rinses as needed for nasal symptoms. Use this before any medicated nasal sprays for best result Continue Claritin 10 mg once a day as needed for a runny nose For thick nasal secretions, begin Mucinex 1200 mg twice a day and increase hydration as possible  Allergic conjunctivitis Continue a lubricating eye drop Some over the counter eye drops include Pataday one drop in each eye once a day as needed for red, itchy eyes OR Zaditor one drop in each eye twice a day as needed for red itchy eyes.  Reflux Continue dietary and lifestyle modifications as listed below  Call the clinic if this treatment plan is not working well for you  Follow up in 2 months or sooner if needed.

## 2020-06-12 DIAGNOSIS — J4550 Severe persistent asthma, uncomplicated: Secondary | ICD-10-CM

## 2020-06-15 ENCOUNTER — Ambulatory Visit (INDEPENDENT_AMBULATORY_CARE_PROVIDER_SITE_OTHER): Payer: Medicare Other

## 2020-06-15 DIAGNOSIS — J4550 Severe persistent asthma, uncomplicated: Secondary | ICD-10-CM | POA: Diagnosis not present

## 2020-06-26 ENCOUNTER — Other Ambulatory Visit: Payer: Self-pay | Admitting: Family Medicine

## 2020-06-29 DIAGNOSIS — J4540 Moderate persistent asthma, uncomplicated: Secondary | ICD-10-CM

## 2020-06-30 ENCOUNTER — Ambulatory Visit (INDEPENDENT_AMBULATORY_CARE_PROVIDER_SITE_OTHER): Payer: Medicare Other

## 2020-06-30 DIAGNOSIS — J4540 Moderate persistent asthma, uncomplicated: Secondary | ICD-10-CM | POA: Diagnosis not present

## 2020-06-30 NOTE — Telephone Encounter (Signed)
Patient came in today to get Xolair and wanted a new rx for albuterol 0.083% called in. He stated that they only give him one box of 70 mL at a time, which is what we send in. He also stated that he uses it bid and needs more boxes at a time.   Per [facility], can dispense 2 boxes of 75 mL

## 2020-07-10 DIAGNOSIS — J4550 Severe persistent asthma, uncomplicated: Secondary | ICD-10-CM

## 2020-07-14 ENCOUNTER — Other Ambulatory Visit: Payer: Self-pay

## 2020-07-14 ENCOUNTER — Ambulatory Visit (INDEPENDENT_AMBULATORY_CARE_PROVIDER_SITE_OTHER): Payer: Medicare Other | Admitting: *Deleted

## 2020-07-14 DIAGNOSIS — J4550 Severe persistent asthma, uncomplicated: Secondary | ICD-10-CM | POA: Diagnosis not present

## 2020-07-14 DIAGNOSIS — J4540 Moderate persistent asthma, uncomplicated: Secondary | ICD-10-CM

## 2020-07-27 DIAGNOSIS — J4550 Severe persistent asthma, uncomplicated: Secondary | ICD-10-CM | POA: Diagnosis not present

## 2020-07-28 ENCOUNTER — Ambulatory Visit (INDEPENDENT_AMBULATORY_CARE_PROVIDER_SITE_OTHER): Payer: Medicare Other

## 2020-07-28 ENCOUNTER — Other Ambulatory Visit: Payer: Self-pay

## 2020-07-28 DIAGNOSIS — J4540 Moderate persistent asthma, uncomplicated: Secondary | ICD-10-CM

## 2020-07-28 DIAGNOSIS — J4550 Severe persistent asthma, uncomplicated: Secondary | ICD-10-CM | POA: Diagnosis not present

## 2020-08-03 ENCOUNTER — Other Ambulatory Visit: Payer: Self-pay | Admitting: Family Medicine

## 2020-08-04 ENCOUNTER — Telehealth: Payer: Self-pay | Admitting: Family Medicine

## 2020-08-04 NOTE — Telephone Encounter (Signed)
Pa submitted thru cover my meds for performist

## 2020-08-04 NOTE — Telephone Encounter (Signed)
PT STATES PHARMACY NEEDS PA FOR  PERFOROMIST 20 MCG/2ML nebulizer solution

## 2020-08-06 ENCOUNTER — Telehealth: Payer: Self-pay

## 2020-08-06 NOTE — Telephone Encounter (Signed)
PA was denied for perforomist so appeals letter is attached

## 2020-08-07 DIAGNOSIS — J4540 Moderate persistent asthma, uncomplicated: Secondary | ICD-10-CM | POA: Diagnosis not present

## 2020-08-10 ENCOUNTER — Ambulatory Visit (INDEPENDENT_AMBULATORY_CARE_PROVIDER_SITE_OTHER): Payer: Medicare Other

## 2020-08-10 ENCOUNTER — Other Ambulatory Visit: Payer: Self-pay

## 2020-08-10 DIAGNOSIS — J4540 Moderate persistent asthma, uncomplicated: Secondary | ICD-10-CM | POA: Diagnosis not present

## 2020-08-21 DIAGNOSIS — J4550 Severe persistent asthma, uncomplicated: Secondary | ICD-10-CM | POA: Diagnosis not present

## 2020-08-22 ENCOUNTER — Other Ambulatory Visit: Payer: Self-pay | Admitting: Family Medicine

## 2020-08-22 DIAGNOSIS — J4550 Severe persistent asthma, uncomplicated: Secondary | ICD-10-CM

## 2020-08-24 ENCOUNTER — Ambulatory Visit (INDEPENDENT_AMBULATORY_CARE_PROVIDER_SITE_OTHER): Payer: Medicare Other

## 2020-08-24 DIAGNOSIS — J4550 Severe persistent asthma, uncomplicated: Secondary | ICD-10-CM

## 2020-09-06 DIAGNOSIS — J4550 Severe persistent asthma, uncomplicated: Secondary | ICD-10-CM

## 2020-09-07 ENCOUNTER — Ambulatory Visit (INDEPENDENT_AMBULATORY_CARE_PROVIDER_SITE_OTHER): Payer: Medicare Other

## 2020-09-07 ENCOUNTER — Encounter: Payer: Self-pay | Admitting: Family Medicine

## 2020-09-07 ENCOUNTER — Ambulatory Visit: Payer: Medicare Other | Admitting: Family Medicine

## 2020-09-07 ENCOUNTER — Other Ambulatory Visit: Payer: Self-pay

## 2020-09-07 ENCOUNTER — Ambulatory Visit (HOSPITAL_BASED_OUTPATIENT_CLINIC_OR_DEPARTMENT_OTHER)
Admission: RE | Admit: 2020-09-07 | Discharge: 2020-09-07 | Disposition: A | Discharge status: Home / Self Care | Payer: Medicare Other | Source: Ambulatory Visit | Attending: Family Medicine | Admitting: Family Medicine

## 2020-09-07 VITALS — BP 150/72 | HR 70 | Temp 98.4°F | Resp 18 | Ht 64.5 in | Wt 158.6 lb

## 2020-09-07 DIAGNOSIS — J3089 Other allergic rhinitis: Secondary | ICD-10-CM

## 2020-09-07 DIAGNOSIS — J4551 Severe persistent asthma with (acute) exacerbation: Secondary | ICD-10-CM

## 2020-09-07 DIAGNOSIS — K219 Gastro-esophageal reflux disease without esophagitis: Secondary | ICD-10-CM

## 2020-09-07 DIAGNOSIS — H1010 Acute atopic conjunctivitis, unspecified eye: Secondary | ICD-10-CM

## 2020-09-07 DIAGNOSIS — J449 Chronic obstructive pulmonary disease, unspecified: Secondary | ICD-10-CM

## 2020-09-07 DIAGNOSIS — R059 Cough, unspecified: Secondary | ICD-10-CM

## 2020-09-07 DIAGNOSIS — Z7901 Long term (current) use of anticoagulants: Secondary | ICD-10-CM

## 2020-09-07 DIAGNOSIS — Z952 Presence of prosthetic heart valve: Secondary | ICD-10-CM

## 2020-09-07 DIAGNOSIS — J4540 Moderate persistent asthma, uncomplicated: Secondary | ICD-10-CM

## 2020-09-07 DIAGNOSIS — J4550 Severe persistent asthma, uncomplicated: Secondary | ICD-10-CM | POA: Diagnosis not present

## 2020-09-07 DIAGNOSIS — H1013 Acute atopic conjunctivitis, bilateral: Secondary | ICD-10-CM

## 2020-09-07 MED ORDER — MONTELUKAST SODIUM 10 MG PO TABS
ORAL_TABLET | ORAL | 4 refills | Status: DC
Start: 2020-09-07 — End: 2021-12-06

## 2020-09-07 MED ORDER — PREDNISONE 5 MG PO TABS: 5.0000 mg | ORAL_TABLET | Freq: Every day | ORAL | 1 refills | Status: DC

## 2020-09-07 MED ORDER — CEFDINIR 300 MG PO CAPS: 300.0000 mg | ORAL_CAPSULE | Freq: Two times a day (BID) | ORAL | 0 refills | Status: DC

## 2020-09-07 NOTE — Progress Notes (Signed)
Can you please call this patient and let him know his chest xray was clear. Thank you

## 2020-09-07 NOTE — Progress Notes (Signed)
[address] [place] [address] Dept: [phone]  FOLLOW UP NOTE  Patient ID: Caleb Mills, male    DOB: 1951-05-14  Age: 70 y.o. MRN: [id number] Date of Office Visit: 09/07/2020  Assessment  Chief Complaint: Asthma (Still not normal, coughing prednisone Jan & Feb. Still has tightness in chest./Every mid-afternoon has to do albuterol treatment.  )  HPI Caleb Mills is a 70 year old male who presents to the clinic for follow-up visit.  He was last seen in this clinic on 06/01/2020 for evaluation of asthma, allergic rhinitis, allergic conjunctivitis, cough, and reflux.  He does have a history of heart valve replacement on chronic Coumadin.  Of note, he did have COVID in November 2021.  At today's visit he reports his asthma has been poorly controlled with shortness of breath with activity, wheeze with activity, and cough producing thick yellow mucus.  He did receive cefdinir and prednisone in both January and February with moderate relief of symptoms.  He continues montelukast 10 mg once a day, budesonide 0.5 mg twice a day via nebulizer twice a day, and albuterol 2-3 times a day.  He continues Xolair injections once every 2 weeks and reports that he is feeling somewhat better after receiving these injections.  He is resistant to switch Xolair to another biologic.  He has previously tried [employer], engineering and [place].  Information was provided regarding Riki Altes today.  Allergic rhinitis is reported as moderately well controlled with Claritin, azelastine, Flonase, and saline nasal rinses as needed.  Allergic conjunctivitis is reported as well controlled allergy eyedrops as needed.  Reflux is reported as moderately well controlled with no current medical intervention. He does report that he had a gastric surgery several years ago and since that time, he has the most trouble with breathing after eating. His current medications are listed in the chart.    Drug Allergies:  Allergies  Allergen Reactions  .  Augmentin [Amoxicillin-Pot Clavulanate] Nausea Only  . Ciprofloxacin Other (See Comments)    Joint pain  . Azelastine Other (See Comments)    Dry eyes    Physical Exam: BP (!) 150/72 (BP Location: Left Arm, Patient Position: Sitting, Cuff Size: Normal)   Pulse 70   Temp 98.4 F (36.9 C) (Temporal)   Resp 18   Ht 5' 4.5" (1.638 m)   Wt 158 lb 9 oz (71.9 kg)   SpO2 96%   BMI 26.80 kg/m    Physical Exam Vitals reviewed.  Constitutional:      Appearance: Normal appearance.  HENT:     Head: Normocephalic and atraumatic.     Right Ear: Tympanic membrane normal.     Left Ear: Tympanic membrane normal.     Nose:     Comments: Bilateral nares slightly erythematous with clear nasal drainage noted.  Pharynx normal.  Ears normal.  Eyes normal.  Slight septal deviation noted.    Mouth/Throat:     Pharynx: Oropharynx is clear.  Eyes:     Conjunctiva/sclera: Conjunctivae normal.  Cardiovascular:     Rate and Rhythm: Normal rate and regular rhythm.     Heart sounds: Normal heart sounds. No murmur heard.   Pulmonary:     Effort: Pulmonary effort is normal.     Breath sounds: Normal breath sounds.     Comments: Lungs clear to auscultation Musculoskeletal:        General: Normal range of motion.     Cervical back: Normal range of motion and neck supple.  Skin:  General: Skin is warm and dry.  Neurological:     Mental Status: He is alert and oriented to person, place, and time.  Psychiatric:        Mood and Affect: Mood normal.        Behavior: Behavior normal.        Thought Content: Thought content normal.        Judgment: Judgment normal.     Diagnostics: FVC 2.19, FEV1 0.90.  Predicted FVC 3.57, predicted FEV1 2.60.  Spirometry indicates moderate restriction and severe airway obstruction.  Postbronchodilator FVC 2.45, FEV1 1.05.  Postbronchodilator spirometry indicates mild restriction and severe airway obstruction with slight improvement postbronchodilator therapy.  12%  improvement in FVC and 17% in FEV1.  Assessment and Plan: 1. Severe persistent asthma with acute exacerbation   2. Asthma-COPD overlap syndrome (HCC)   3. Other allergic rhinitis   4. Seasonal allergic conjunctivitis   5. Gastroesophageal reflux disease, unspecified whether esophagitis present   6. Prophylactic use of warfarin for venous thromboembolism   7. History of aortic valve replacement   8. Cough     Meds ordered this encounter  Medications  . montelukast (SINGULAIR) 10 MG tablet    Sig: TAKE 1 TABLET BY MOUTH AT  NIGHT TO PREVENT COUGHING  OR WHEEZING    Dispense:  90 tablet    Refill:  4    Requesting 1 year supply  . predniSONE (DELTASONE) 5 MG tablet    Sig: Take 1 tablet (5 mg total) by mouth daily with breakfast.    Dispense:  30 tablet    Refill:  1    Patient Instructions  Asthma COPD overlap syndrome Continue montelukast 10 mg once a day to prevent cough or wheeze For now, continue budesonide 0.5 mg twice a day via nebulizer and Perforomist twice a day via nebulizer. Continue albuterol 2 puffs every 4 hours as needed for cough or wheeze OR Instead use albuterol 0.083% solution via nebulizer one unit vial every 4 hours as needed for cough or wheeze Consider a low dose prednisone 5 mg tablet once a day to control asthma symptoms. Evaluate this with your primary care provider in order to maintain control of INR and coumadin changes.   Continue Xolair injections once every 2 weeks  Consider Tezpire biological injection. This would replace Xolair injections. Written information provided  Cough Chest xray We will call you as soon as we get the result of the chest xray Begin Cefdinir 300 twice a day for 10 days.   Allergic rhinitis Continue Flonase 2 sprays in each nostril once a day as needed for a stuffy nose Continue azelastine 2 sprays in each nostril twice a day as needed for a runny nose Consider saline nasal rinses as needed for nasal symptoms. Use this  before any medicated nasal sprays for best result Continue Claritin 10 mg once a day as needed for a runny nose For thick nasal secretions, begin Mucinex 1200 mg twice a day and increase hydration as possible  Allergic conjunctivitis Continue a lubricating eye drop Some over the counter eye drops include Pataday one drop in each eye once a day as needed for red, itchy eyes OR Zaditor one drop in each eye twice a day as needed for red itchy eyes.  Reflux Continue dietary and lifestyle modifications as listed below Recommend referral to GI specialist for evaluation and treatment.   Call the clinic if this treatment plan is not working well for you  Follow  up in 2 months or sooner if needed.    Return in about 2 months (around 11/07/2020), or if symptoms worsen or fail to improve.    Thank you for the opportunity to care for this patient.  Please do not hesitate to contact me with questions.  Thermon Leyland, FNP [facility]

## 2020-09-07 NOTE — Addendum Note (Signed)
Addended by: Hetty Blend on: 09/07/2020 01:37 PM   Modules accepted: Orders

## 2020-09-07 NOTE — Patient Instructions (Addendum)
Asthma COPD overlap syndrome Continue montelukast 10 mg once a day to prevent cough or wheeze For now, continue budesonide 0.5 mg twice a day via nebulizer and Perforomist twice a day via nebulizer. Continue albuterol 2 puffs every 4 hours as needed for cough or wheeze OR Instead use albuterol 0.083% solution via nebulizer one unit vial every 4 hours as needed for cough or wheeze Consider a low dose prednisone 5 mg tablet once a day to control asthma symptoms. Evaluate this with your primary care provider in order to maintain control of INR and coumadin changes.   Continue Xolair injections once every 2 weeks  Consider Tezpire biological injection. This would replace Xolair injections. Written information provided  Cough Chest xray We will call you as soon as we get the result of the chest xray Begin Cefdinir 300 twice a day for 10 days.   Allergic rhinitis Continue Flonase 2 sprays in each nostril once a day as needed for a stuffy nose Continue azelastine 2 sprays in each nostril twice a day as needed for a runny nose Consider saline nasal rinses as needed for nasal symptoms. Use this before any medicated nasal sprays for best result Continue Claritin 10 mg once a day as needed for a runny nose For thick nasal secretions, begin Mucinex 1200 mg twice a day and increase hydration as possible  Allergic conjunctivitis Continue a lubricating eye drop Some over the counter eye drops include Pataday one drop in each eye once a day as needed for red, itchy eyes OR Zaditor one drop in each eye twice a day as needed for red itchy eyes.  Reflux Continue dietary and lifestyle modifications as listed below Recommend referral to GI specialist for evaluation and treatment.   Call the clinic if this treatment plan is not working well for you  Follow up in 2 months or sooner if needed.

## 2020-09-08 ENCOUNTER — Other Ambulatory Visit: Payer: Self-pay | Admitting: Family Medicine

## 2020-09-14 ENCOUNTER — Telehealth: Payer: Self-pay

## 2020-09-14 NOTE — Telephone Encounter (Signed)
Received a fax from [employer] requesting a signature from Caleb Mills to verify the actual prescription sent in is correct.   Gave to Caleb Mills to review and I will fax as soon as it is returned to me.

## 2020-09-18 DIAGNOSIS — J4550 Severe persistent asthma, uncomplicated: Secondary | ICD-10-CM | POA: Diagnosis not present

## 2020-09-21 ENCOUNTER — Other Ambulatory Visit: Payer: Self-pay

## 2020-09-21 ENCOUNTER — Ambulatory Visit (INDEPENDENT_AMBULATORY_CARE_PROVIDER_SITE_OTHER): Payer: Medicare Other

## 2020-09-21 DIAGNOSIS — J4550 Severe persistent asthma, uncomplicated: Secondary | ICD-10-CM

## 2020-10-02 DIAGNOSIS — J4550 Severe persistent asthma, uncomplicated: Secondary | ICD-10-CM

## 2020-10-05 ENCOUNTER — Ambulatory Visit (INDEPENDENT_AMBULATORY_CARE_PROVIDER_SITE_OTHER): Payer: Medicare Other

## 2020-10-05 ENCOUNTER — Other Ambulatory Visit: Payer: Self-pay

## 2020-10-05 DIAGNOSIS — J4550 Severe persistent asthma, uncomplicated: Secondary | ICD-10-CM

## 2020-10-07 ENCOUNTER — Other Ambulatory Visit: Payer: Self-pay | Admitting: Family Medicine

## 2020-10-15 DIAGNOSIS — J4550 Severe persistent asthma, uncomplicated: Secondary | ICD-10-CM | POA: Diagnosis not present

## 2020-10-19 ENCOUNTER — Other Ambulatory Visit: Payer: Self-pay

## 2020-10-19 ENCOUNTER — Ambulatory Visit (INDEPENDENT_AMBULATORY_CARE_PROVIDER_SITE_OTHER): Payer: Medicare Other

## 2020-10-19 DIAGNOSIS — J4550 Severe persistent asthma, uncomplicated: Secondary | ICD-10-CM

## 2020-10-23 ENCOUNTER — Encounter: Payer: Self-pay | Admitting: Allergy and Immunology

## 2020-10-23 ENCOUNTER — Other Ambulatory Visit: Payer: Self-pay

## 2020-10-23 ENCOUNTER — Ambulatory Visit (INDEPENDENT_AMBULATORY_CARE_PROVIDER_SITE_OTHER): Payer: Medicare Other | Admitting: Allergy and Immunology

## 2020-10-23 ENCOUNTER — Other Ambulatory Visit: Payer: Self-pay | Admitting: Family Medicine

## 2020-10-23 VITALS — BP 120/72 | HR 72 | Resp 14

## 2020-10-23 DIAGNOSIS — R1013 Epigastric pain: Secondary | ICD-10-CM | POA: Diagnosis not present

## 2020-10-23 DIAGNOSIS — J4550 Severe persistent asthma, uncomplicated: Secondary | ICD-10-CM

## 2020-10-23 DIAGNOSIS — J3089 Other allergic rhinitis: Secondary | ICD-10-CM | POA: Diagnosis not present

## 2020-10-23 DIAGNOSIS — R6881 Early satiety: Secondary | ICD-10-CM | POA: Diagnosis not present

## 2020-10-23 MED ORDER — TEZEPELUMAB-EKKO 210 MG/1.91ML ~~LOC~~ SOSY
210.0000 mg | PREFILLED_SYRINGE | Freq: Once | SUBCUTANEOUS | Status: AC
  Administered 2020-10-23: 210 mg via SUBCUTANEOUS

## 2020-10-23 MED ORDER — ALBUTEROL SULFATE (2.5 MG/3ML) 0.083% IN NEBU: INHALATION_SOLUTION | RESPIRATORY_TRACT | 1 refills | Status: DC

## 2020-10-23 MED ORDER — OMEPRAZOLE 40 MG PO CPDR: 40.0000 mg | DELAYED_RELEASE_CAPSULE | Freq: Every day | ORAL | 5 refills | Status: DC

## 2020-10-23 NOTE — Patient Instructions (Addendum)
  1.  Start Tezepelumab injections today and every 4 weeks  2. Treat and prevent inflammation:   A. Budesonide + Perforomist nebulized 2 times per day  B. Flonase - 1 spray each nostril 2 times per day after nasal wash  C. Montelukast 10 mg - 1 tablet 1 time per day  3. If needed:   A. Albuterol nebulization or 2 puffs every 4-6 hours  B. Loratadine 10 mg - 1 tablet 1 time per day  4. For early satiety issue perform the following:   A. Start omeprazole 40 mg - 1 tablet 1 time per day  B. Obtain barium swallow  C. Make appointment to see gastroenterologist  5. Return to clinic in 4 weeks or earlier if problem.

## 2020-10-23 NOTE — Progress Notes (Signed)
[place] [facility] [place] [facility] [place] [facility] [place] [facility] [place]   Follow-up Note  Referring Provider: Charlette Caffey, MD Primary Provider: Charlette Caffey, MD Date of Office Visit: 10/23/2020  Subjective:   Caleb Mills (DOB: 1951-03-21) is a 70 y.o. male who [facility] in re-evaluation of the following:  HPI: Cutter returns to this clinic in evaluation of asthma.  I have never seen him in this clinic and his last visit with our nurse practitioner was on 07 September 2020.  While utilizing a collection of medications directed against respiratory tract inflammation, including the use of Xolair, and previously benralizumab, he continues to have issues with daily wheezing and coughing and nocturnal bronchospastic symptoms multiple times per week and requirement for using a short acting bronchodilator of approximately 10 times per day.  Most recently he was placed on daily prednisone at 5 mg a day for the past 30 days which did not help him at all.  In addition, he has a history of chronic sinusitis requiring nasal surgery and consistently uses Flonase and nasal washes which he thinks helps his upper airways extensively.  He also relates a history of developing chest tightness and shortness of breath after eating.  This has been a progressive issue over the course of the past year or 2.  He has now reverted to eating very small meals multiple times per day.  He has had a Nissen fundoplication performed in 1996 and he thinks that most of his previous reflux symptomatology resolved with that surgical procedure.  He has been treated with many different types of medications in the past and has had some side effects from some of his medications.  The plan that he is utilizing right now does not appear to give him any side effects.  He tried [place] in the past but this gave rise to a very fast heart rate and he discontinued these  agents.  Allergies as of 10/23/2020      Reactions   Augmentin [amoxicillin-pot Clavulanate] Nausea Only   Ciprofloxacin Other (See Comments)   Joint pain   Azelastine Other (See Comments)   Dry eyes      Medication List      budesonide 0.5 MG/2ML nebulizer solution Commonly known as: PULMICORT USE 2 ML(0.5 MG) VIA NEBULIZER TWICE DAILY   EPINEPHrine 0.3 mg/0.3 mL Soaj injection Commonly known as: EPI-PEN Inject 0.3 mg into the muscle as needed.   fluticasone 50 MCG/ACT nasal spray Commonly known as: FLONASE Place 1 spray into both nostrils as needed (patient will take one to two sprays each nostril depending upon the weather).   levothyroxine 50 MCG tablet Commonly known as: SYNTHROID Take 100 mcg by mouth daily.   loratadine 10 MG tablet Commonly known as: CLARITIN Take 10 mg by mouth daily.   montelukast 10 MG tablet Commonly known as: SINGULAIR TAKE 1 TABLET BY MOUTH AT  NIGHT TO PREVENT COUGHING  OR WHEEZING   multivitamin tablet Take 1 tablet daily by mouth.   Perforomist 20 MCG/2ML nebulizer solution Generic drug: formoterol USE 1 VIAL VIA NEBULIZER TWICE DAILY   predniSONE 5 MG tablet Commonly known as: DELTASONE Take 1 tablet (5 mg total) by mouth daily with breakfast.   Ventolin HFA 108 (90 Base) MCG/ACT inhaler Generic drug: albuterol USE 2 INHALATIONS BY MOUTH  EVERY 4 HOURS AS NEEDED FOR WHEEZING OR SHORTNESS OF  BREATH   albuterol (2.5 MG/3ML) 0.083% nebulizer solution  Commonly known as: PROVENTIL USE 1 VIAL VIA NEBULIZER EVERY 4 HOURS AS NEEDED FOR WHEEZING OR SHORTNESS OF BREATH   VITAMIN A PO Take by mouth daily.   VITAMIN D PO Take by mouth daily.   warfarin 10 MG tablet Commonly known as: COUMADIN Take 10 mg by mouth. Patient takes warfarin 10mg  3 days per week and on other days 5mg    ZINC PO Take by mouth.       Past Medical History:  Diagnosis Date  . Asthma   . S/P total hip resurfacing 2009   left hip    Past  Surgical History:  Procedure Laterality Date  . AORTIC VALVE REPLACEMENT    . HIP RESURFACING Left 2009  . ROTATOR CUFF REPAIR Right 2009  . TOTAL HIP ARTHROPLASTY Right july 2014    Review of systems negative except as noted in HPI / PMHx or noted below:  Review of Systems  Constitutional: Negative.   HENT: Negative.   Eyes: Negative.   Respiratory: Negative.   Cardiovascular: Negative.   Gastrointestinal: Negative.   Genitourinary: Negative.   Musculoskeletal: Negative.   Skin: Negative.   Neurological: Negative.   Endo/Heme/Allergies: Negative.   Psychiatric/Behavioral: Negative.      Objective:   Vitals:   10/23/20 1023  BP: 120/72  Pulse: 72  Resp: 14  SpO2: 97%          Physical Exam Constitutional:      Appearance: He is not diaphoretic.  HENT:     Head: Normocephalic.     Right Ear: Tympanic membrane, ear canal and external ear normal.     Left Ear: Tympanic membrane, ear canal and external ear normal.     Nose: Nose normal. No mucosal edema or rhinorrhea.     Mouth/Throat:     Pharynx: Uvula midline. No oropharyngeal exudate.  Eyes:     Conjunctiva/sclera: Conjunctivae normal.  Neck:     Thyroid: No thyromegaly.     Trachea: Trachea normal. No tracheal tenderness or tracheal deviation.  Cardiovascular:     Rate and Rhythm: Normal rate and regular rhythm.     Heart sounds: Normal heart sounds, S1 normal and S2 normal. No murmur heard.   Pulmonary:     Effort: No respiratory distress.     Breath sounds: No stridor. Wheezing (Bilateral expiratory wheezes all lung fields) present. No rales.  Lymphadenopathy:     Head:     Right side of head: No tonsillar adenopathy.     Left side of head: No tonsillar adenopathy.     Cervical: No cervical adenopathy.  Skin:    Findings: No erythema or rash.     Nails: There is no clubbing.  Neurological:     Mental Status: He is alert.     Diagnostics:    Spirometry was performed and demonstrated an  FEV1 of 0.84 at 31  % of predicted.  The patient had an Asthma Control Test with the following results:  .    Results of a chest CT scan obtained 03 September 2020 identified the following:  Cardiovascular: Atherosclerotic calcification of the aorta.  Ascending aorta measures 4.2 cm, stable. Aortic valve replacement.  Heart size normal. No pericardial effusion.   Mediastinum/Nodes: Thyroidectomy. Mildly heterogeneous and partially  calcified high left paratracheal mass measures 3.2 x 4.2 cm,  unchanged. There is slight rightward deviation of the trachea.  Mediastinal lymph nodes measure up to 11 mm in the low right  paratracheal station, unchanged. Hilar  regions are difficult to  evaluate without IV contrast. No axillary adenopathy. Esophagus is  grossly unremarkable.   Lungs/Pleura: Mild scarring in the lung bases. Lungs are otherwise  clear. No pleural fluid. Airway is otherwise unremarkable.   Upper Abdomen: Visualized portions of the liver, adrenal glands,  kidneys, spleen and pancreas are unremarkable. Surgical clips are  seen around the proximal stomach. Cholecystectomy. No upper  abdominal adenopathy.    Results of blood tests obtained 11 December 2019 identified IgE antibodies directed against dust mite, cat, dog, mouse, negative hypersensitivity pneumonitis screen  Results of blood tests obtained 13 September 2017 identified WBC 6.6, absolute eosinophil 300, absolute lymphocyte 700, hemoglobin 13.2, platelet 247  Assessment and Plan:   1. Early satiety   2. Postprandial epigastric pain   3. Not well controlled severe persistent asthma   4. Perennial allergic rhinitis     1.  Start Tezepelumab injections today and every 4 weeks  2. Treat and prevent inflammation:   A. Budesonide + Perforomist nebulized 2 times per day  B. Flonase - 1 spray each nostril 2 times per day after nasal wash  C. Montelukast 10 mg - 1 tablet 1 time per day  3. If needed:   A. Albuterol nebulization  or 2 puffs every 4-6 hours  B. Loratadine 10 mg - 1 tablet 1 time per day  4. For early satiety issue perform the following:   A. Start omeprazole 40 mg - 1 tablet 1 time per day  B. Obtain barium swallow  C. Make appointment to see gastroenterologist  5. Return to clinic in 4 weeks or earlier if problem.  Vong has very extensive inflammation of his airway and there may be some permanent anatomical changes that have occurred over the course of the past several decades as well especially given his longstanding asthma and a history of reflux induced respiratory disease.  We will treat him with an anti-TSLP antibody to replace his Xolair and he will continue to use his other anti-inflammatory agents for his airway and we need to address his early satiety and postprandial discomfort issue with the plan noted above.  I will regroup with him in 4 weeks or earlier if there is a problem.  Laurette Schimke, MD Allergy / Immunology [facility]

## 2020-10-23 NOTE — Progress Notes (Signed)
Patient started sample of Tezspire in the office today.  Received injection and waited approximately 30 minutes without any notable reactions.  Patient has signed consent form.  He does have an EpiPen.

## 2020-10-26 ENCOUNTER — Other Ambulatory Visit: Payer: Self-pay | Admitting: Allergy and Immunology

## 2020-10-26 ENCOUNTER — Encounter: Payer: Self-pay | Admitting: Allergy and Immunology

## 2020-10-26 ENCOUNTER — Telehealth: Payer: Self-pay | Admitting: *Deleted

## 2020-10-26 DIAGNOSIS — R6881 Early satiety: Secondary | ICD-10-CM

## 2020-10-26 DIAGNOSIS — R1013 Epigastric pain: Secondary | ICD-10-CM

## 2020-10-26 NOTE — Telephone Encounter (Signed)
-----   Message from Anibal Henderson, [place] sent at 10/23/2020 12:26 PM EDT ----- Hi Raha Tennison,  Dr. Lucie Leather started patient on sample of Tezspire. He has signed the consent form.  Please help.  I did not schedule a follow-up appointment yet since I was not sure about getting approval.  Thank you- Dahlia Client :-)

## 2020-10-26 NOTE — Telephone Encounter (Signed)
Patient advised started Tezspire on Friday in clinic and all weekend issues with asthma and some eye irritation.  He advises he does not want to continue Tezspire but stay on Xolair for his asthma. I advised him I would let Dr Lucie Leather know

## 2020-10-28 NOTE — Addendum Note (Signed)
Addended by: Berna Bue on: 10/28/2020 02:52 PM   Modules accepted: Orders

## 2020-11-02 ENCOUNTER — Ambulatory Visit: Payer: Medicare Other

## 2020-11-04 ENCOUNTER — Telehealth: Payer: Self-pay

## 2020-11-04 NOTE — Telephone Encounter (Signed)
Faxed order to [facility] for Barium Swallow study [phone] fax).  No prior authorization required per insurance- follows Medicare guidelines  ( call reference # [id number]).  Will call tomorrow to follow-up on study appointment and then reach out to patient.

## 2020-11-06 NOTE — Telephone Encounter (Signed)
Barium Swallow study scheduled for Monday, May 23rd at [facility], needing to check-in at 9:45am.  Patient will need to refrain from eating or drinking starting one hour prior to the study.  He can enter the facility at the patient entrance of the hospital.  I have sent a MyChart message to patient with this information.

## 2020-11-09 ENCOUNTER — Other Ambulatory Visit: Payer: Self-pay | Admitting: Family Medicine

## 2020-11-10 DIAGNOSIS — J4550 Severe persistent asthma, uncomplicated: Secondary | ICD-10-CM | POA: Diagnosis not present

## 2020-11-10 NOTE — Telephone Encounter (Signed)
[facility] staff, please see if there is still a dose left for him in [facility] and call patient to schedule next appointment.  Will let Tammy know.

## 2020-11-10 NOTE — Telephone Encounter (Signed)
Called patient and advised restart Xolair and appt

## 2020-11-10 NOTE — Telephone Encounter (Signed)
Talked with patient today.  He is interested in restarting the Xolair.  Please advise.

## 2020-11-10 NOTE — Telephone Encounter (Signed)
Please restart omalizumab administration.

## 2020-11-10 NOTE — Telephone Encounter (Signed)
Called patient and informed him of upcoming appointment for Barium Swallow Study.  He had not read the MyChart message I sent. Gave patient the telephone number for [facility] in case he needs to reschedule.

## 2020-11-11 ENCOUNTER — Ambulatory Visit (INDEPENDENT_AMBULATORY_CARE_PROVIDER_SITE_OTHER): Payer: Medicare Other

## 2020-11-11 ENCOUNTER — Other Ambulatory Visit: Payer: Self-pay

## 2020-11-11 DIAGNOSIS — J4550 Severe persistent asthma, uncomplicated: Secondary | ICD-10-CM | POA: Diagnosis not present

## 2020-11-16 ENCOUNTER — Ambulatory Visit (INDEPENDENT_AMBULATORY_CARE_PROVIDER_SITE_OTHER): Payer: Medicare Other | Admitting: Gastroenterology

## 2020-11-16 ENCOUNTER — Ambulatory Visit: Payer: Medicare Other | Admitting: Gastroenterology

## 2020-11-16 ENCOUNTER — Encounter: Payer: Self-pay | Admitting: Gastroenterology

## 2020-11-16 ENCOUNTER — Other Ambulatory Visit: Payer: Self-pay

## 2020-11-16 VITALS — BP 138/92 | HR 70 | Ht 63.0 in | Wt 155.5 lb

## 2020-11-16 DIAGNOSIS — R1013 Epigastric pain: Secondary | ICD-10-CM | POA: Diagnosis not present

## 2020-11-16 DIAGNOSIS — K219 Gastro-esophageal reflux disease without esophagitis: Secondary | ICD-10-CM | POA: Diagnosis not present

## 2020-11-16 DIAGNOSIS — J45909 Unspecified asthma, uncomplicated: Secondary | ICD-10-CM

## 2020-11-16 DIAGNOSIS — R140 Abdominal distension (gaseous): Secondary | ICD-10-CM | POA: Diagnosis not present

## 2020-11-16 NOTE — Progress Notes (Signed)
Chief Complaint: Postprandial abdominal discomfort  Referring Provider:  Charlette Caffey, MD      ASSESSMENT AND PLAN;   #1. Postprandial epi discomfort with bloating. S/P Nissen's fundoplication 1996.   #2. GERD without any significant dysphagia. CT chest followed by PET scan 01/2020 showed moderate displacement of esophagus.  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  #3.  Multiple comorbid conditions including asthma with wheezing, AVR on coumadin  Plan:  Change Ba swallow to UGI series (may 23) Omepreazole 40mg  po qd If still with problems, CT AP followed by solid-phase gastric emptying scan. If still with problems, proceed with EGD.  He is not keen on getting EGD performed since he has to be off Coumadin for that. RTC in 12 weeks    HPI:    Caleb Mills is a 70 y.o. male  With multiple medical problems including mech AVR on coumadin 2006 (followed by Dr. 2007 at [facility]), asthma with continued wheezing, S/P thyroidectomy, cholecystectomy, appendicectomy, Nissen's fundoplication  With C/O postprandial abdo bloating with buldging, early satierty x 2 yrs, getting somewhat worse.  He does not have any significant dysphagia.  No heartburn.  He denies having any diarrhea or constipation.  Complains chest tightness and shortness of breath after eating.  No recent weight loss.  No sodas, chocolates, chewing gums, artificial sweeteners and candy. No NSAIDs  Had EGD prior to fundoplication in [facility].  Nothing thereafter.  Has seen GI in [place]-for EGD/colonoscopy.  He was told not to get any endoscopic procedures due to multiple comorbid conditions and being on Coumadin.  Patient moved to [facility] in 2017  CT chest followed by PET scan 01/2020 showed moderate displacement of esophagus.  He was seen by Dr. 02/2020 and has been scheduled for barium swallow in coming days.  We we will change it to upper GI series.  Patient is not keen  on getting endoscopy or colonoscopy at this time.   Previous work-up: CT chest 09/2020: Negative  PET 01/2020 1. Stable 3.5 cm partially calcified mass in the superior aspect of the anterior mediastinum. Moderate displacement of the trachea and esophagus to the right side. No hypermetabolism to suggest metabolically active neoplasm. 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 2. No worrisome pulmonary lesions or hypermetabolic mediastinal or hilar lymph nodes. 3. No significant findings involving the abdomen/pelvis. 4. Advanced vascular disease. Past Medical History:  Diagnosis Date  . Asthma   . 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 AORTIC VALVE REPLACEMENT    . APPENDECTOMY  1973  . CHOLECYSTECTOMY  1996  . HIATAL HERNIA REPAIR    . HIP RESURFACING Left 2009  . HIP RESURFACING  2014  . NASAL SINUS SURGERY    . REPLACEMENT TOTAL HIP W/  RESURFACING IMPLANTS Right 2009  . ROTATOR CUFF REPAIR Right 2009  . THYROIDECTOMY  2003  . TOTAL HIP ARTHROPLASTY Right july 2014    Family History  Problem Relation Age of Onset  . Asthma Mother   . Heart disease Mother   . Diabetes Father   . Alzheimer's disease Father   . Allergic rhinitis Neg Hx   . Angioedema Neg Hx   . Atopy Neg Hx   . Eczema Neg Hx   . Immunodeficiency Neg Hx   . Urticaria Neg Hx   . Colon polyps Neg Hx   . Esophageal cancer  Neg Hx   . Irritable bowel syndrome Neg Hx   . Liver cancer Neg Hx   . Pancreatic cancer Neg Hx   . Rectal cancer Neg Hx   . Stomach cancer Neg Hx   . Wilson's disease Neg Hx     Social History   Tobacco Use  . Smoking status: Never Smoker  . Smokeless tobacco: Never Used  Vaping Use  . Vaping Use: Never used  Substance Use Topics  . Alcohol use: No    Alcohol/week: 0.0 standard drinks  . Drug use: No    Current Outpatient Medications  Medication Sig Dispense Refill  . albuterol (PROVENTIL) (2.5 MG/3ML)  0.083% nebulizer solution USE 1 VIAL VIA NEBULIZER EVERY 4 HOURS AS NEEDED FOR WHEEZING OR SHORTNESS OF BREATH 150 mL 2  . budesonide (PULMICORT) 0.5 MG/2ML nebulizer solution USE 2 ML(0.5 MG) VIA NEBULIZER TWICE DAILY 120 mL 5  . EPINEPHrine 0.3 mg/0.3 mL IJ SOAJ injection Inject 0.3 mg into the muscle as needed.    . fluticasone (FLONASE) 50 MCG/ACT nasal spray Place 1 spray into both nostrils as needed (patient will take one to two sprays each nostril depending upon the weather).     [name] levothyroxine (SYNTHROID) 100 MCG tablet Take 100 mcg by mouth daily.    [name] loratadine (CLARITIN) 10 MG tablet Take 10 mg by mouth daily.    . montelukast (SINGULAIR) 10 MG tablet TAKE 1 TABLET BY MOUTH AT  NIGHT TO PREVENT COUGHING  OR WHEEZING 90 tablet 4  . Multiple Vitamins-Minerals (ZINC PO) Take by mouth.    [name] omeprazole (PRILOSEC) 40 MG capsule Take 1 capsule (40 mg total) by mouth daily. 30 capsule 5  . PERFOROMIST 20 MCG/2ML nebulizer solution USE 1 VIAL VIA NEBULIZER TWICE DAILY 120 mL 5  . VENTOLIN HFA 108 (90 Base) MCG/ACT inhaler USE 2 INHALATIONS BY MOUTH  EVERY 4 HOURS AS NEEDED FOR WHEEZING OR SHORTNESS OF  BREATH 54 g 1  . VITAMIN A PO Take by mouth daily.    [name] VITAMIN D PO Take by mouth daily.    [name] warfarin (COUMADIN) 10 MG tablet Take 10 mg by mouth. Patient takes warfarin 10mg  3 days per week and on other days 5mg   Dr Medical  1   No current facility-administered medications for this visit.    Allergies  Allergen Reactions  . Augmentin [Amoxicillin-Pot Clavulanate] Nausea Only  . Ciprofloxacin Other (See Comments)    Joint pain  . Azelastine Other (See Comments)    Dry eyes    Review of Systems:  Constitutional: Denies fever, chills, diaphoresis, appetite change and fatigue.  HEENT: Denies photophobia, eye pain, redness, hearing loss, ear pain, congestion, sore throat, rhinorrhea, sneezing, mouth sores, neck pain, neck stiffness and tinnitus.   Respiratory: Has  SOB, DOE, cough, chest tightness,  and wheezing.   Cardiovascular: Denies chest pain, palpitations and leg swelling.  Genitourinary: Denies dysuria, urgency, frequency, hematuria, flank pain and difficulty urinating.  Musculoskeletal: Denies myalgias, back pain, joint swelling, arthralgias and gait problem.  Skin: No rash.  Neurological: Denies dizziness, seizures, syncope, weakness, light-headedness, numbness and headaches.  Hematological: Denies adenopathy. Has Easy bruising, personal or family bleeding history  Psychiatric/Behavioral: has anxiety or depression     Physical Exam:    BP (!) 138/92 (BP Location: Left Arm, Patient Position: Sitting, Cuff Size: Normal)   Pulse 70   Ht 5\' 3"  (1.6 m)   Wt 155 lb 8 oz (70.5 kg)  BMI 27.55 kg/m  Wt Readings from Last 3 Encounters:  11/16/20 155 lb 8 oz (70.5 kg)  09/07/20 158 lb 9 oz (71.9 kg)  11/12/19 148 lb 12.8 oz (67.5 kg)   Constitutional:  Well-developed, in no acute distress. Psychiatric: Normal mood and affect. Behavior is normal. HEENT: Pupils normal.  Conjunctivae are normal. No scleral icterus. Neck supple.  Well-healed surgical scars Cardiovascular: Normal rate, regular rhythm. No edema Pulmonary/chest: Bilateral decreased breath sounds with wheezing.  Patient told me that that is his baseline. Abdominal: Soft, nondistended. Nontender. Bowel sounds active throughout. There are no masses palpable. No hepatomegaly. Rectal: Deferred Neurological: Alert and oriented to person place and time. Skin: Skin is warm and dry. No rashes noted.  Data Reviewed: I have personally reviewed following labs and imaging studies  CBC: CBC Latest Ref Rng & Units 11/13/2017  WBC 3.4 - 10.8 x10E3/uL 6.6  Hemoglobin 13.0 - 17.7 g/dL 30.1  Hematocrit 31.4 - 51.0 % 42.2  Platelets 150 - 379 x10E3/uL 247      Edman Circle, MD 11/16/2020, 9:09 AM  Cc: Charlette Caffey, MD

## 2020-11-16 NOTE — Patient Instructions (Signed)
If you are age 72 or older, your body mass index should be between 23-30. Your Body mass index is 27.55 kg/m. If this is out of the aforementioned range listed, please consider follow up with your Primary Care Provider.  If you are age 22 or younger, your body mass index should be between 19-25. Your Body mass index is 27.55 kg/m. If this is out of the aformentioned range listed, please consider follow up with your Primary Care Provider.   You have been scheduled for an Upper GI Series and Small Bowel Follow Thru at [facility]  . Your appointment is on May 23rd at 9:45am . Please arrive 15 minutes prior to your test for registration. Make certain not to have anything to eat or drink after midnight on the night before your test. If you need to reschedule, please contact radiology at [phone].  [name] --------------------------------------------------------------------------------------------------------------- An upper GI series uses x rays to help diagnose problems of the upper GI tract, which includes the esophagus, stomach, and duodenum. The duodenum is the first part of the small intestine. An upper GI series is conducted by a radiology technologist or a radiologist--a doctor who specializes in x-ray imaging--at a hospital or outpatient center. While sitting or standing in front of an x-ray machine, the patient drinks barium liquid, which is often white and has a chalky consistency and taste. The barium liquid coats the lining of the upper GI tract and makes signs of disease show up more clearly on x rays. X-ray video, called fluoroscopy, is used to view the barium liquid moving through the esophagus, stomach, and duodenum. Additional x rays and fluoroscopy are performed while the patient lies on an x-ray table. To fully coat the upper GI tract with barium liquid, the technologist or radiologist may press on the abdomen or ask the patient to change position. Patients hold still in  various positions, allowing the technologist or radiologist to take x rays of the upper GI tract at different angles. If a technologist conducts the upper GI series, a radiologist will later examine the images to look for problems.  This test typically takes about 1 hour to complete --------------------------------------------------------------------------------------------------------------------------------------------- The Small Bowel Follow Thru examination is used to visualize the entire small bowel (intestines); specifically the connection between the small and large intestine. You will be positioned on a flat x-ray table and an image of your abdomen taken. Then the technologist will show the x-ray to the radiologist. The radiologist will instruct your technologist how much (1-2 cups) barium sulfate you will drink and when to begin taking the timed x-rays, usually 15-30 minutes after you begin drinking. Barium is a harmless substance that will highlight your small intestine by absorbing x-ray. The taste is chalky and it feels very heavy both in the cup and in your stomach.  After the first x-ray is taken and shown to the radiologist, he/she will determine when the next image is to be taken. This is repeated until the barium has reached the end of the small intestine and enters the beginning of the colon (cecum). At such time when the barium spills into the colon, you will be positioned on the x-ray table once again. The radiologist will use a fluoroscopic camera to take some detailed pictures of the connection between your small intestine and colon. The fluoroscope is an x-ray unit that works with a television/computer screen. The radiologist will apply pressure to your abdomen with his/her hand and a lead glove, a plastic paddle,  or a paddle with an inflated rubber balloon on the end. This is to spread apart your loops of intestine so he/she can see all areas.   This test typically takes around 1 hour  to complete.  Important  Drink plenty of water (8-10 cups/day) for a few days following the procedure to avoid constipation and blockage. The barium will make your stools white for a few days. --------------------------------------------------------------------------------------------------------------------------------------------  Continue Omeprazole  Please call in 12 weeks to schedule follow appointment.   Thank you,  Dr. Lynann Bologna

## 2020-11-23 ENCOUNTER — Telehealth: Payer: Self-pay | Admitting: Gastroenterology

## 2020-11-23 NOTE — Telephone Encounter (Signed)
Results are not back yet.  

## 2020-11-23 NOTE — Telephone Encounter (Signed)
PT called stated that he has a Berium swallow done today and wanted to know the results. Please give him a call. Thank you

## 2020-11-24 DIAGNOSIS — J4550 Severe persistent asthma, uncomplicated: Secondary | ICD-10-CM | POA: Diagnosis not present

## 2020-11-25 ENCOUNTER — Other Ambulatory Visit: Payer: Self-pay

## 2020-11-25 ENCOUNTER — Ambulatory Visit (INDEPENDENT_AMBULATORY_CARE_PROVIDER_SITE_OTHER): Payer: Medicare Other

## 2020-11-25 DIAGNOSIS — J4550 Severe persistent asthma, uncomplicated: Secondary | ICD-10-CM | POA: Diagnosis not present

## 2020-11-27 ENCOUNTER — Encounter: Payer: Self-pay | Admitting: Allergy and Immunology

## 2020-11-27 ENCOUNTER — Ambulatory Visit (INDEPENDENT_AMBULATORY_CARE_PROVIDER_SITE_OTHER): Payer: Medicare Other | Admitting: Allergy and Immunology

## 2020-11-27 ENCOUNTER — Telehealth: Payer: Self-pay

## 2020-11-27 ENCOUNTER — Other Ambulatory Visit: Payer: Self-pay

## 2020-11-27 VITALS — BP 124/66 | HR 85 | Temp 95.0°F | Resp 22

## 2020-11-27 DIAGNOSIS — J3089 Other allergic rhinitis: Secondary | ICD-10-CM | POA: Diagnosis not present

## 2020-11-27 DIAGNOSIS — J4550 Severe persistent asthma, uncomplicated: Secondary | ICD-10-CM

## 2020-11-27 DIAGNOSIS — K219 Gastro-esophageal reflux disease without esophagitis: Secondary | ICD-10-CM | POA: Diagnosis not present

## 2020-11-27 MED ORDER — BUDESONIDE 0.5 MG/2ML IN SUSP: RESPIRATORY_TRACT | 5 refills | Status: DC

## 2020-11-27 MED ORDER — PERFOROMIST 20 MCG/2ML IN NEBU: INHALATION_SOLUTION | RESPIRATORY_TRACT | 5 refills | Status: DC

## 2020-11-27 MED ORDER — ALBUTEROL SULFATE (2.5 MG/3ML) 0.083% IN NEBU: INHALATION_SOLUTION | RESPIRATORY_TRACT | 2 refills | Status: DC

## 2020-11-27 NOTE — Telephone Encounter (Signed)
Patient needs referral visit with Dr. [name] at severe asthma clinic at [facility], per Dr. Lucie Leather order. Patient was seen in office today.

## 2020-11-27 NOTE — Progress Notes (Signed)
[place] [facility] [place]   Follow-up Note  Referring Provider: Charlette Caffey, MD Primary Provider: Charlette Caffey, MD Date of Office Visit: 11/27/2020  Subjective:   Caleb Mills (DOB: [birth year]) is a 71 y.o. male who [facility] in re-evaluation of the following:  HPI: Caleb Mills returns to this clinic in evaluation of asthma, history of chronic sinusitis, and swallowing difficulties with postprandial abdominal discomfort.  His last visit to this clinic was 23 October 2020.  During his last visit we started him on a anti-TSLP antibody to replace his omalizumab as he continued to have very significant problems with his respiratory tract.  Unfortunately, the weekend after receiving that injection, he felt as though his asthma was worse and his eyes were burning and irritated and he would like to remain on omalizumab and not continue on an anti-TSLP antibody.  He required an antibiotic and a systemic steroid administered by his primary care doctor to get his lungs under better control.  He is now using omalizumab and continues on his other anti-inflammatory agents.  He still continues to have daily wheezing and coughing and nocturnal bronchospastic symptoms multiple times per week and uses a bronchodilator multiple times per day.  He believes that his upper airways are under pretty good control at this point.  He did visit with gastroenterology regarding his swallowing issue.  His barium swallow identified pretty good esophageal motility mechanics but there was evidence of reflux.  Over the course of the past week he has been consistently using his omeprazole and has consolidated his caffeine consumption and is actually doing a lot better.  Allergies as of 11/27/2020      Reactions   Augmentin [amoxicillin-pot Clavulanate] Nausea Only   Ciprofloxacin Other (See Comments)   Joint pain   Azelastine Other (See  Comments)   Dry eyes      Medication List      budesonide 0.5 MG/2ML nebulizer solution Commonly known as: PULMICORT USE 2 ML(0.5 MG) VIA NEBULIZER TWICE DAILY   EPINEPHrine 0.3 mg/0.3 mL Soaj injection Commonly known as: EPI-PEN Inject 0.3 mg into the muscle as needed.   fluticasone 50 MCG/ACT nasal spray Commonly known as: FLONASE Place 1 spray into both nostrils as needed (patient will take one to two sprays each nostril depending upon the weather).   levothyroxine 100 MCG tablet Commonly known as: SYNTHROID Take 100 mcg by mouth daily.   loratadine 10 MG tablet Commonly known as: CLARITIN Take 10 mg by mouth daily.   montelukast 10 MG tablet Commonly known as: SINGULAIR TAKE 1 TABLET BY MOUTH AT  NIGHT TO PREVENT COUGHING  OR WHEEZING   omeprazole 40 MG capsule Commonly known as: PRILOSEC Take 1 capsule (40 mg total) by mouth daily.   Perforomist 20 MCG/2ML nebulizer solution Generic drug: formoterol USE 1 VIAL VIA NEBULIZER TWICE DAILY   Ventolin HFA 108 (90 Base) MCG/ACT inhaler Generic drug: albuterol USE 2 INHALATIONS BY MOUTH  EVERY 4 HOURS AS NEEDED FOR WHEEZING OR SHORTNESS OF  BREATH   albuterol (2.5 MG/3ML) 0.083% nebulizer solution Commonly known as: PROVENTIL USE 1 VIAL VIA NEBULIZER EVERY 4 HOURS AS NEEDED FOR WHEEZING OR SHORTNESS OF BREATH   VITAMIN A PO Take by mouth daily.   VITAMIN D PO Take by mouth daily.   warfarin 10 MG tablet Commonly known as: COUMADIN Take 10 mg by mouth. Patient takes warfarin 10mg  3 days per  week and on other days 5mg   Dr Medical   ZINC PO Take by mouth.       Past Medical History:  Diagnosis Date  . Asthma   . 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 AORTIC VALVE REPLACEMENT    . APPENDECTOMY  1973  . CHOLECYSTECTOMY  1996  . HIATAL HERNIA REPAIR    . HIP RESURFACING Left 2009  . HIP RESURFACING  2014  . NASAL SINUS  SURGERY    . REPLACEMENT TOTAL HIP W/  RESURFACING IMPLANTS Right 2009  . ROTATOR CUFF REPAIR Right 2009  . THYROIDECTOMY  2003  . TOTAL HIP ARTHROPLASTY Right july 2014    Review of systems negative except as noted in HPI / PMHx or noted below:  Review of Systems  Constitutional: Negative.   HENT: Negative.   Eyes: Negative.   Respiratory: Negative.   Cardiovascular: Negative.   Gastrointestinal: Negative.   Genitourinary: Negative.   Musculoskeletal: Negative.   Skin: Negative.   Neurological: Negative.   Endo/Heme/Allergies: Negative.   Psychiatric/Behavioral: Negative.      Objective:   Vitals:   11/27/20 1009  BP: 124/66  Pulse: 85  Resp: (!) 22  Temp: (!) 95 F (35 C)  SpO2: 95%          Physical Exam Constitutional:      Appearance: He is not diaphoretic.  HENT:     Head: Normocephalic.     Right Ear: Tympanic membrane, ear canal and external ear normal.     Left Ear: Tympanic membrane, ear canal and external ear normal.     Nose: Nose normal. No mucosal edema or rhinorrhea.     Mouth/Throat:     Pharynx: Uvula midline. No oropharyngeal exudate.  Eyes:     Conjunctiva/sclera: Conjunctivae normal.  Neck:     Thyroid: No thyromegaly.     Trachea: Trachea normal. No tracheal tenderness or tracheal deviation.  Cardiovascular:     Rate and Rhythm: Normal rate and regular rhythm.     Heart sounds: Normal heart sounds, S1 normal and S2 normal. No murmur heard.   Pulmonary:     Effort: No respiratory distress.     Breath sounds: Normal breath sounds. No stridor. No wheezing or rales.  Lymphadenopathy:     Head:     Right side of head: No tonsillar adenopathy.     Left side of head: No tonsillar adenopathy.     Cervical: No cervical adenopathy.  Skin:    Findings: No erythema or rash.     Nails: There is no clubbing.  Neurological:     Mental Status: He is alert.     Diagnostics:    Spirometry was performed and demonstrated an FEV1 of 0.88 at  37 % of predicted.  Baseline oxygen saturation while at rest on room air was 96%.  Oxygen saturation on room air while walking in the hallway was 96%.  Results of a barium swallow obtained 23 Nov 2020 identifies the following:  Examination plain film scout of the abdomen demonstrates scattered  large and small bowel gas. Mild retained fecal material is noted  without constipation. Changes of prior cholecystectomy and prior  reflux surgery in the left upper quadrant are noted. Bilateral hip  surgery is seen as well.   Swallowing mechanism and esophageal transit time are within normal  limits. The esophageal mucosa is unremarkable. Barium  tablet passed  without difficulty. Mild reflux is seen although no evidence of  reflux esophagitis is noted.   Stomach distends well and demonstrates no intraluminal filling  defect. Brisk passage of contrast material into the proximal small  bowel is noted. Duodenal bulb and duodenal sweep are within normal  limits.    Assessment and Plan:   1. Not well controlled severe persistent asthma   2. Perennial allergic rhinitis   3. Gastroesophageal reflux disease, unspecified whether esophagitis present     1.  Continue to Treat and prevent inflammation:   A. Budesonide + Perforomist nebulized 2 times per day  B. Flonase - 1 spray each nostril 2 times per day after nasal wash  C. Montelukast 10 mg - 1 tablet 1 time per day  D. Omalizumab injections  3. If needed:   A. Albuterol nebulization or 2 puffs every 4-6 hours  B. Loratadine 10 mg - 1 tablet 1 time per day  4. For early satiety and postprandial GI issue use the following:   A. Omeprazole 40 mg - 1 tablet 1 time per day  B.  Minimize caffeine as much as possible  5.  Blood -alpha-1 antitrypsin level and phenotype, CBC with differential  6.  Visit with Dr. [name] at severe asthma clinic at [facility]  7. Return to clinic in 12 weeks or earlier if problem  Jammy appears to be stable  with stability defined by continuous respiratory tract symptoms from his lung condition and a recent requirement for a systemic steroid.  Interestingly, on further discussion today he does tell me that his mom had severe asthma most of her life.  We will check an alpha-1 antitrypsin level and phenotype to look for an inherited form of lung disease although certainly his previous CT scan does not identify a significant amount of emphysema.  He will remain on a very large collection of anti-inflammatory agents for his airway as noted above and continue to treat reflux as this has helped his GI issue.  I think he would be best served by having a visit with Dr. [name] at the severe asthma clinic at [facility] to see if there is any additional therapy that can be prescribed.  Laurette Schimke, MD Allergy / Immunology [facility]

## 2020-11-27 NOTE — Patient Instructions (Addendum)
  1.  Continue to Treat and prevent inflammation:   A. Budesonide + Perforomist nebulized 2 times per day  B. Flonase - 1 spray each nostril 2 times per day after nasal wash  C. Montelukast 10 mg - 1 tablet 1 time per day  D. Omalizumab injections  3. If needed:   A. Albuterol nebulization or 2 puffs every 4-6 hours  B. Loratadine 10 mg - 1 tablet 1 time per day  4. For early satiety and postprandial GI issue use the following:   A. Omeprazole 40 mg - 1 tablet 1 time per day  B. Minimize caffeine as much as possible  5.  Blood -alpha-1 antitrypsin level and phenotype, CBC with differential     6.  Visit with Dr. [name] at severe asthma clinic at [facility]  7. Return to clinic in 12 weeks or earlier if problem

## 2020-12-01 ENCOUNTER — Encounter: Payer: Self-pay | Admitting: Allergy and Immunology

## 2020-12-03 ENCOUNTER — Other Ambulatory Visit (HOSPITAL_COMMUNITY): Payer: Self-pay | Admitting: Orthopaedic Surgery

## 2020-12-03 ENCOUNTER — Other Ambulatory Visit: Payer: Self-pay | Admitting: Orthopaedic Surgery

## 2020-12-03 DIAGNOSIS — M25552 Pain in left hip: Secondary | ICD-10-CM

## 2020-12-04 DIAGNOSIS — J4550 Severe persistent asthma, uncomplicated: Secondary | ICD-10-CM

## 2020-12-07 ENCOUNTER — Other Ambulatory Visit: Payer: Self-pay

## 2020-12-07 ENCOUNTER — Ambulatory Visit (INDEPENDENT_AMBULATORY_CARE_PROVIDER_SITE_OTHER): Payer: Medicare Other

## 2020-12-07 ENCOUNTER — Telehealth: Payer: Self-pay

## 2020-12-07 DIAGNOSIS — J4550 Severe persistent asthma, uncomplicated: Secondary | ICD-10-CM | POA: Diagnosis not present

## 2020-12-07 MED ORDER — OMALIZUMAB 150 MG ~~LOC~~ SOLR
225.0000 mg | SUBCUTANEOUS | Status: DC
  Administered 2020-12-07 – 2022-12-28 (×52): 225 mg via SUBCUTANEOUS

## 2020-12-07 MED ORDER — OMALIZUMAB 150 MG/ML ~~LOC~~ SOSY: 225.0000 mg | PREFILLED_SYRINGE | SUBCUTANEOUS | Status: DC

## 2020-12-07 MED ORDER — AZITHROMYCIN 250 MG PO TABS: ORAL_TABLET | ORAL | 0 refills | Status: DC

## 2020-12-07 NOTE — Telephone Encounter (Signed)
Zithromax 250 mg sent into [facility] on [address] in [facility]. Per Dr. Lucie Leather.

## 2020-12-07 NOTE — Telephone Encounter (Signed)
Please call out a azithromycin Z-Pak for Fayrene Fearing.  Does have an appointment to see Dr. [name], pulmonary medicine, at [facility] yet?

## 2020-12-07 NOTE — Telephone Encounter (Signed)
Patient would like a Z-Pak called out. Per patient," he is still having a lot of congestion. In the past Z-Pak and prednisone worked well". He has prednisone at home to start with the Z-Pak. Please advise

## 2020-12-14 ENCOUNTER — Ambulatory Visit (HOSPITAL_COMMUNITY)
Admission: RE | Admit: 2020-12-14 | Discharge: 2020-12-14 | Disposition: A | Discharge status: Home / Self Care | Payer: Medicare Other | Source: Ambulatory Visit | Attending: Orthopaedic Surgery | Admitting: Orthopaedic Surgery

## 2020-12-14 ENCOUNTER — Other Ambulatory Visit: Payer: Self-pay

## 2020-12-14 ENCOUNTER — Encounter (HOSPITAL_COMMUNITY)
Admission: RE | Admit: 2020-12-14 | Discharge: 2020-12-14 | Disposition: A | Discharge status: Home / Self Care | Payer: Medicare Other | Source: Ambulatory Visit | Attending: Orthopaedic Surgery | Admitting: Orthopaedic Surgery

## 2020-12-14 DIAGNOSIS — M25552 Pain in left hip: Secondary | ICD-10-CM | POA: Diagnosis not present

## 2020-12-14 MED ORDER — TECHNETIUM TC 99M MEDRONATE IV KIT
20.0000 | PACK | Freq: Once | INTRAVENOUS | Status: AC | PRN
  Administered 2020-12-14: 20 via INTRAVENOUS

## 2020-12-20 DIAGNOSIS — J4550 Severe persistent asthma, uncomplicated: Secondary | ICD-10-CM

## 2020-12-21 ENCOUNTER — Other Ambulatory Visit: Payer: Self-pay

## 2020-12-21 ENCOUNTER — Ambulatory Visit (INDEPENDENT_AMBULATORY_CARE_PROVIDER_SITE_OTHER): Payer: Medicare Other

## 2020-12-21 DIAGNOSIS — J4550 Severe persistent asthma, uncomplicated: Secondary | ICD-10-CM

## 2020-12-24 LAB — CBC WITH DIFFERENTIAL/PLATELET
Absolute Monocytes: 608 cells/uL (ref 200–950)
Basophils Absolute: 58 cells/uL (ref 0–200)
Basophils Relative: 0.9 %
Eosinophils Absolute: 211 cells/uL (ref 15–500)
Eosinophils Relative: 3.3 %
HCT: 42.6 % (ref 38.5–50.0)
Hemoglobin: 13.8 g/dL (ref 13.2–17.1)
Lymphs Abs: 954 cells/uL (ref 850–3900)
MCH: 30 pg (ref 27.0–33.0)
MCHC: 32.4 g/dL (ref 32.0–36.0)
MCV: 92.6 fL (ref 80.0–100.0)
MPV: 10.6 fL (ref 7.5–12.5)
Monocytes Relative: 9.5 %
Neutro Abs: 4570 cells/uL (ref 1500–7800)
Neutrophils Relative %: 71.4 %
Platelets: 225 10*3/uL (ref 140–400)
RBC: 4.6 10*6/uL (ref 4.20–5.80)
RDW: 13 % (ref 11.0–15.0)
Total Lymphocyte: 14.9 %
WBC: 6.4 10*3/uL (ref 3.8–10.8)

## 2020-12-24 LAB — ALPHA-1-ANTITRYPSIN PHENOTYP

## 2020-12-24 LAB — ALPHA-1-ANTITRYPSIN: A-1 Antitrypsin, Ser: 189 mg/dL (ref 83–199)

## 2021-01-05 DIAGNOSIS — J4550 Severe persistent asthma, uncomplicated: Secondary | ICD-10-CM | POA: Diagnosis not present

## 2021-01-06 ENCOUNTER — Ambulatory Visit (INDEPENDENT_AMBULATORY_CARE_PROVIDER_SITE_OTHER): Payer: Medicare Other

## 2021-01-06 ENCOUNTER — Other Ambulatory Visit: Payer: Self-pay

## 2021-01-06 DIAGNOSIS — J4550 Severe persistent asthma, uncomplicated: Secondary | ICD-10-CM

## 2021-01-07 ENCOUNTER — Other Ambulatory Visit: Payer: Self-pay | Admitting: Family Medicine

## 2021-01-07 DIAGNOSIS — J4550 Severe persistent asthma, uncomplicated: Secondary | ICD-10-CM

## 2021-01-18 ENCOUNTER — Other Ambulatory Visit: Payer: Self-pay

## 2021-01-18 MED ORDER — PERFOROMIST 20 MCG/2ML IN NEBU: INHALATION_SOLUTION | RESPIRATORY_TRACT | 5 refills | Status: DC

## 2021-01-19 DIAGNOSIS — J4550 Severe persistent asthma, uncomplicated: Secondary | ICD-10-CM | POA: Diagnosis not present

## 2021-01-20 ENCOUNTER — Other Ambulatory Visit: Payer: Self-pay

## 2021-01-20 ENCOUNTER — Ambulatory Visit (INDEPENDENT_AMBULATORY_CARE_PROVIDER_SITE_OTHER): Payer: Medicare Other

## 2021-01-20 DIAGNOSIS — J4550 Severe persistent asthma, uncomplicated: Secondary | ICD-10-CM

## 2021-02-02 DIAGNOSIS — J4550 Severe persistent asthma, uncomplicated: Secondary | ICD-10-CM | POA: Diagnosis not present

## 2021-02-03 ENCOUNTER — Other Ambulatory Visit: Payer: Self-pay

## 2021-02-03 ENCOUNTER — Ambulatory Visit (INDEPENDENT_AMBULATORY_CARE_PROVIDER_SITE_OTHER): Payer: Medicare Other

## 2021-02-03 DIAGNOSIS — J4550 Severe persistent asthma, uncomplicated: Secondary | ICD-10-CM

## 2021-02-16 DIAGNOSIS — J4550 Severe persistent asthma, uncomplicated: Secondary | ICD-10-CM | POA: Diagnosis not present

## 2021-02-17 ENCOUNTER — Ambulatory Visit (INDEPENDENT_AMBULATORY_CARE_PROVIDER_SITE_OTHER): Payer: Medicare Other

## 2021-02-17 ENCOUNTER — Other Ambulatory Visit: Payer: Self-pay | Admitting: Allergy and Immunology

## 2021-02-17 ENCOUNTER — Other Ambulatory Visit: Payer: Self-pay

## 2021-02-17 DIAGNOSIS — J4550 Severe persistent asthma, uncomplicated: Secondary | ICD-10-CM | POA: Diagnosis not present

## 2021-02-17 NOTE — Telephone Encounter (Signed)
Called and spoke to patient to inform him that the refill has been sent in and that he needed an appointment for further refills. Patient agreed and made an appointment with Thurston Hole in the [place] office.

## 2021-03-02 DIAGNOSIS — J4550 Severe persistent asthma, uncomplicated: Secondary | ICD-10-CM | POA: Diagnosis not present

## 2021-03-03 ENCOUNTER — Other Ambulatory Visit: Payer: Self-pay

## 2021-03-03 ENCOUNTER — Ambulatory Visit (INDEPENDENT_AMBULATORY_CARE_PROVIDER_SITE_OTHER): Payer: Medicare Other

## 2021-03-03 DIAGNOSIS — J4550 Severe persistent asthma, uncomplicated: Secondary | ICD-10-CM | POA: Diagnosis not present

## 2021-03-14 IMAGING — CR DG CHEST 2V
2 series · 2 of 2 positions shown · non-contrast
Comparison: Chest radiograph December 11, 2019; chest CT September 03, 2020

CLINICAL DATA: Cough

EXAM:
CHEST - 2 VIEW

[w chest pa]
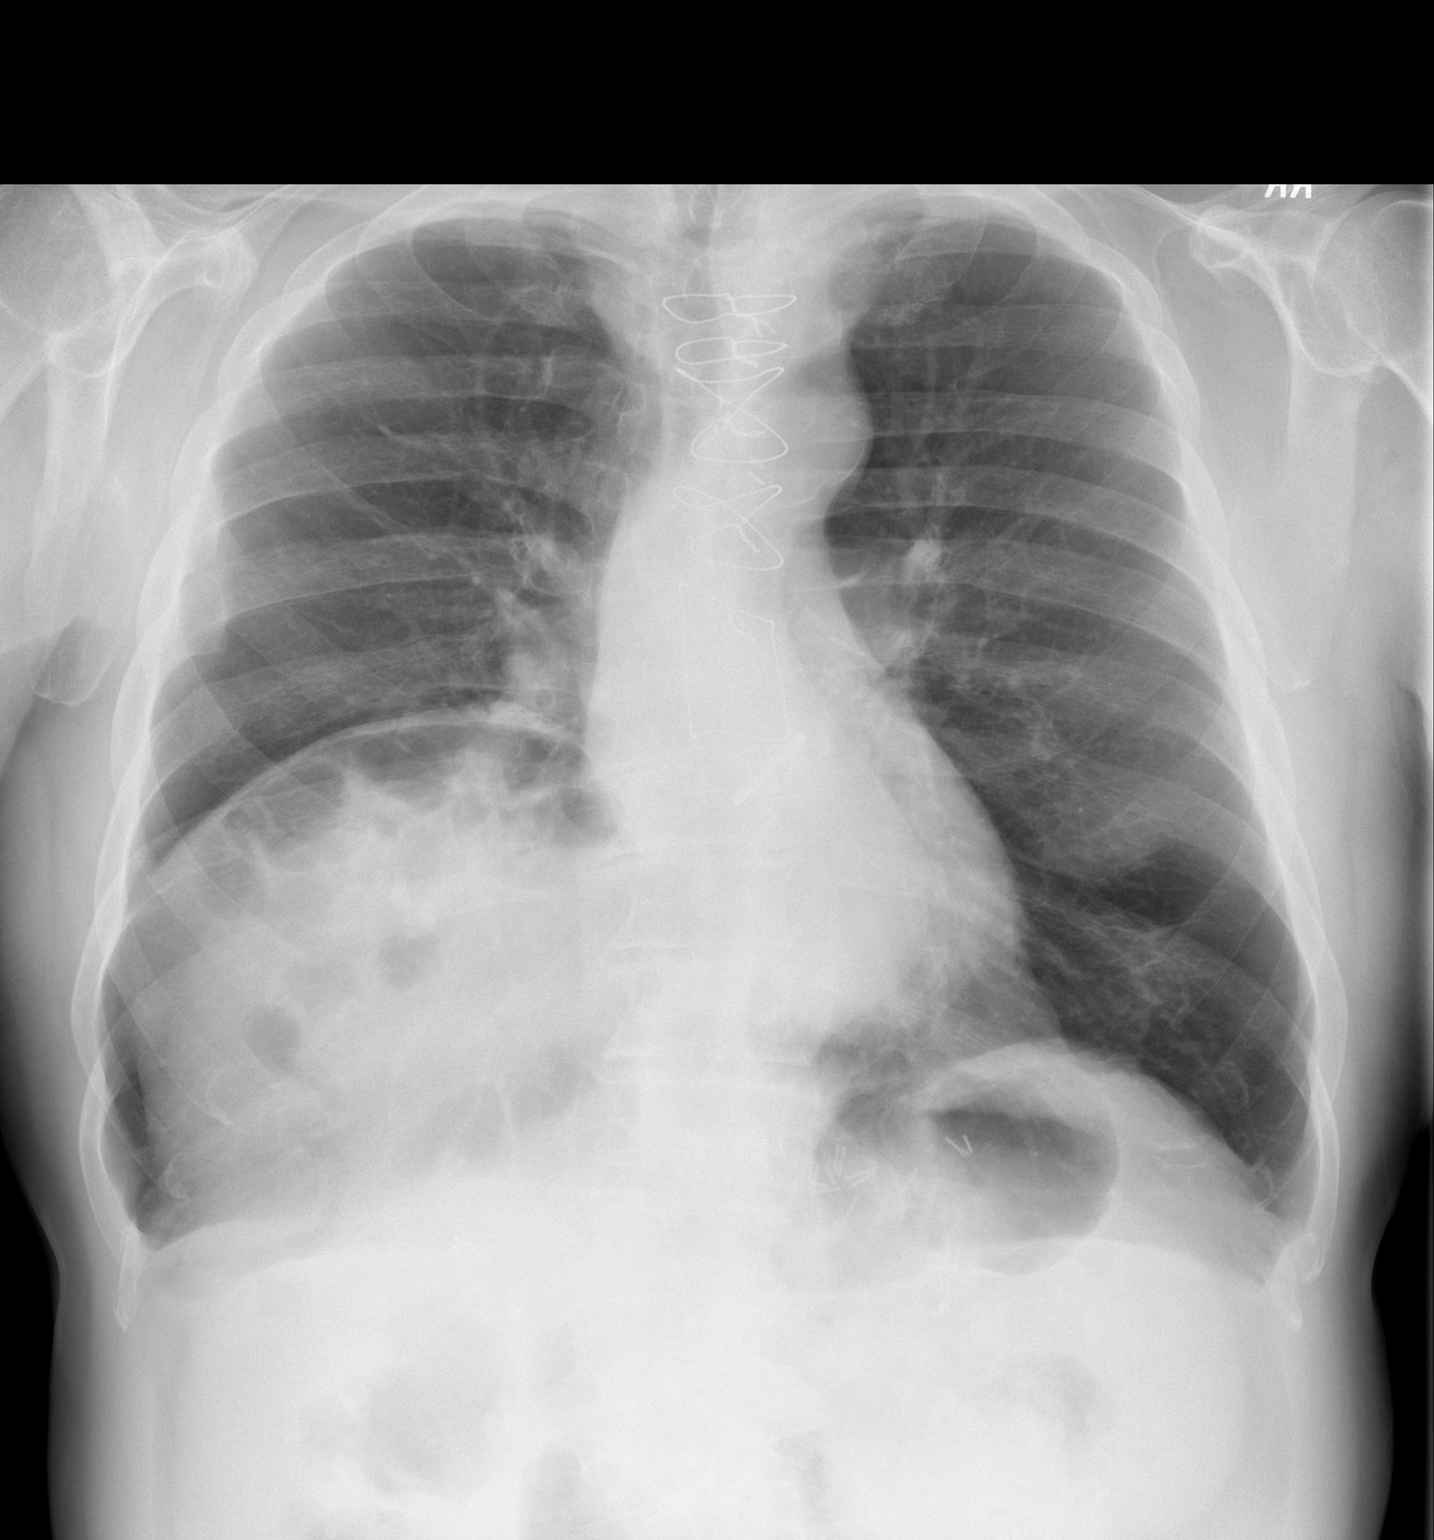

[w chest lat]
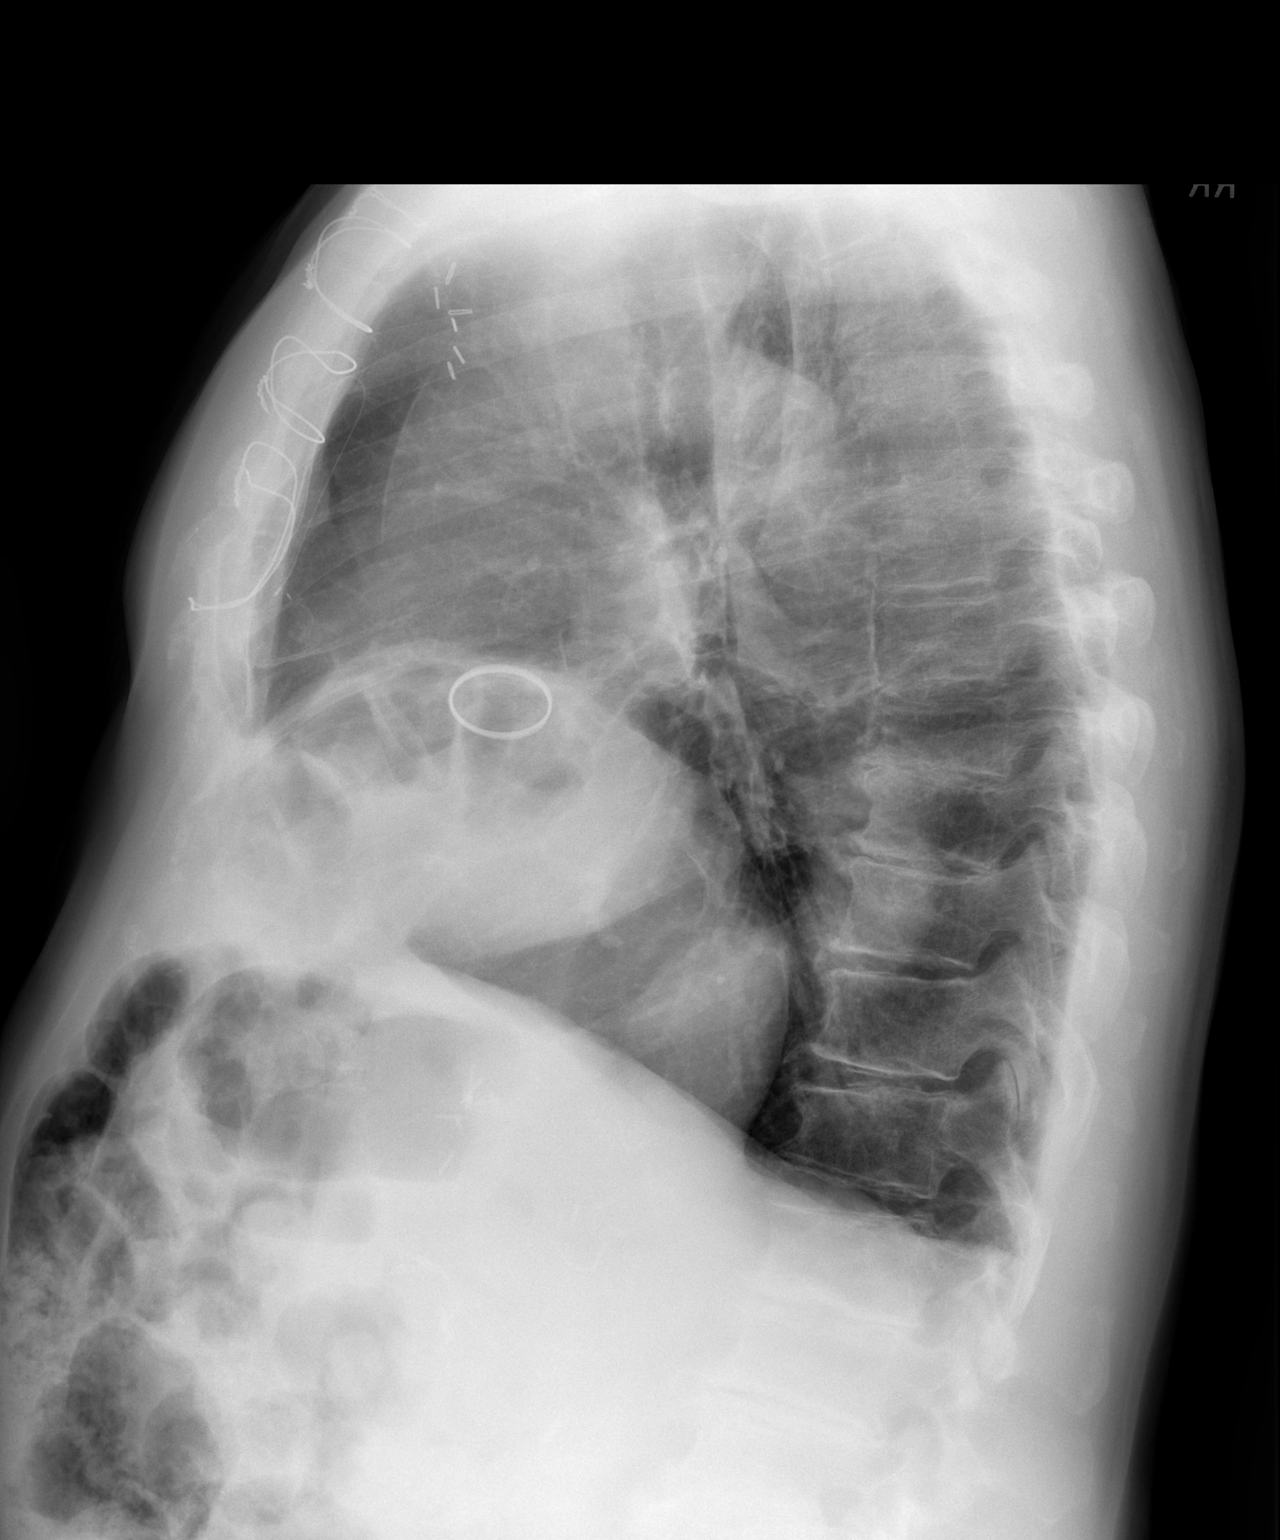

[2 of 2 positions shown; findings below may reference images not displayed]

FINDINGS: There is chronic elevation of the right hemidiaphragm. There is no
edema or airspace opacity. The heart size and pulmonary vascularity
are normal. No adenopathy. Patient is status post aortic valve
replacement. There is aortic tortuosity with areas of aortic
calcification. There is degenerative change in the thoracic spine.
Evidence of previous surgery in the thyroid region.
IMPRESSION: Chronic elevation of right hemidiaphragm. No edema or airspace
opacity. Status post aortic valve replacement. Aortic
atherosclerosis noted. Heart size normal.

## 2021-03-16 DIAGNOSIS — J4550 Severe persistent asthma, uncomplicated: Secondary | ICD-10-CM | POA: Diagnosis not present

## 2021-03-17 ENCOUNTER — Ambulatory Visit: Payer: Medicare Other | Admitting: Family Medicine

## 2021-03-17 ENCOUNTER — Other Ambulatory Visit: Payer: Self-pay

## 2021-03-17 ENCOUNTER — Ambulatory Visit (INDEPENDENT_AMBULATORY_CARE_PROVIDER_SITE_OTHER): Payer: Medicare Other

## 2021-03-17 ENCOUNTER — Encounter: Payer: Self-pay | Admitting: Family Medicine

## 2021-03-17 ENCOUNTER — Telehealth: Payer: Self-pay

## 2021-03-17 VITALS — BP 110/64 | HR 72 | Temp 98.0°F | Resp 12 | Ht 64.0 in | Wt 151.2 lb

## 2021-03-17 DIAGNOSIS — H1010 Acute atopic conjunctivitis, unspecified eye: Secondary | ICD-10-CM

## 2021-03-17 DIAGNOSIS — J449 Chronic obstructive pulmonary disease, unspecified: Secondary | ICD-10-CM

## 2021-03-17 DIAGNOSIS — J3089 Other allergic rhinitis: Secondary | ICD-10-CM

## 2021-03-17 DIAGNOSIS — R6881 Early satiety: Secondary | ICD-10-CM

## 2021-03-17 DIAGNOSIS — J4550 Severe persistent asthma, uncomplicated: Secondary | ICD-10-CM | POA: Diagnosis not present

## 2021-03-17 DIAGNOSIS — K219 Gastro-esophageal reflux disease without esophagitis: Secondary | ICD-10-CM | POA: Diagnosis not present

## 2021-03-17 MED ORDER — FAMOTIDINE 20 MG PO TABS: 20.0000 mg | ORAL_TABLET | Freq: Two times a day (BID) | ORAL | 2 refills | Status: DC

## 2021-03-17 MED ORDER — ALBUTEROL SULFATE HFA 108 (90 BASE) MCG/ACT IN AERS: INHALATION_SPRAY | RESPIRATORY_TRACT | 1 refills | Status: DC

## 2021-03-17 MED ORDER — EPINEPHRINE 0.3 MG/0.3ML IJ SOAJ: 0.3000 mg | INTRAMUSCULAR | 2 refills | Status: DC | PRN

## 2021-03-17 MED ORDER — FAMOTIDINE 20 MG PO TABS: ORAL_TABLET | ORAL | 1 refills | Status: DC

## 2021-03-17 NOTE — Patient Instructions (Addendum)
Asthma COPD overlap syndrome Recommend referral to Dr. [name] at the severe asthma clinic at [facility] for evaluation of severs asthma/COPD and possible oxygen use Continue montelukast 10 mg once a day to prevent cough or wheeze Continue budesonide 0.5 mg twice a day via nebulizer and Perforomist twice a day via nebulizer. Continue albuterol 2 puffs every 4 hours as needed for cough or wheeze OR Instead use albuterol 0.083% solution via nebulizer one unit vial every 4 hours as needed for cough or wheeze Continue Xolair 225 mg injections once every 2 weeks and have access to an epinephrine autoinjector set  Allergic rhinitis Continue Flonase 2 sprays in each nostril once a day as needed for a stuffy nose Continue azelastine 2 sprays in each nostril twice a day as needed for a runny nose Consider saline nasal rinses as needed for nasal symptoms. Use this before any medicated nasal sprays for best result Continue Claritin 10 mg once a day as needed for a runny nose For thick nasal secretions, begin Mucinex 1200 mg twice a day and increase hydration as possible  Allergic conjunctivitis Continue a lubricating eye drop Some over the counter eye drops include Pataday one drop in each eye once a day as needed for red, itchy eyes OR Zaditor one drop in each eye twice a day as needed for red itchy eyes.  Reflux Begin famotidine 20 mg twice a day to control reflux Continue dietary and lifestyle modifications as listed below Continue to follow-up with Dr. [name], GI specialist, as recommended Begin a food journal. Include foods eaten, time food eaten, amount of food, symptoms, and time until symptoms.   Call the clinic if this treatment plan is not working well for you  Follow up in 3 months or sooner if needed.

## 2021-03-17 NOTE — Telephone Encounter (Signed)
Patient was seen today. Caleb Mills recommend's referral to Dr. Vira Mills at the severe asthma clinic at [facility] for evaluation of severe asthma/COPD and possible oxygen use. Patient can go any morning except Tuesday.

## 2021-03-17 NOTE — Progress Notes (Signed)
[address] [place] [address] Dept: [phone]  FOLLOW UP NOTE  Patient ID: Caleb Mills, male    DOB: 1950/11/17  Age: 70 y.o. MRN: [id number] Date of Office Visit: 03/17/2021  Assessment  Chief Complaint: Asthma  HPI Caleb Mills is a 70 year old male who presents the clinic for follow-up visit.  He was last seen in this clinic on 11/27/2020 by Dr. Lucie Leather for evaluation of asthma, allergic rhinitis, reflux, and early satiety.  In the interim, he had azithromycin and prednisone about 2 months ago in addition to a prednisone taper 1-1/2 weeks ago with no improvement of cough producing thin clear mucus.  At today's visit, he reports his asthma has been moderately well controlled with shortness of breath occurring all the time and is exacerbated by activity and humidity, cough producing mucus occurring with activity and humidity, and wheeze occurring only with the cough.  He continues budesonide 0.5 mg twice a day via nebulizer, Perforomist twice a day via nebulizer, montelukast 10 mg once a day, albuterol via nebulizer in the morning 20 minutes before asthma maintenance medications and in the evening 20 minutes before asthma maintenance medications.  He uses albuterol about 3-4 times a day for relief of shortness of breath and cough.  He continues Xolair once every 2 weeks with no adverse reaction.  He is asking if oxygen via nasal cannula may reduce some asthma symptoms while walking in high humidity.  Allergic rhinitis is reported as moderately well controlled with symptoms including occasional nasal congestion and postnasal drainage.  He continues Flonase 2 sprays in each nostril at nighttime and Claritin 10 mg once a day.  He occasionally uses saline nasal rinses.  Reflux is reported as poorly controlled with mucus and chest congestion occurring when he eats too much at one time or eats late at night.  He did try omeprazole for about 3 months with no perceived improvement and has stopped  omeprazole at this time.  His current medications are listed in the chart.  Of note, O2 saturation 97% at rest in the office today and 96% after walking down the stairs, around the office building and back up the stairs.   Drug Allergies:  Allergies  Allergen Reactions   Augmentin [Amoxicillin-Pot Clavulanate] Nausea Only   Ciprofloxacin Other (See Comments)    Joint pain   Azelastine Other (See Comments)    Dry eyes    Physical Exam: BP 110/64 (BP Location: Right Arm, Patient Position: Sitting, Cuff Size: Normal)   Pulse 72   Temp 98 F (36.7 C) (Temporal)   Resp 12   Ht 5\' 4"  (1.626 m)   Wt 151 lb 3.2 oz (68.6 kg)   SpO2 97%   BMI 25.95 kg/m    Physical Exam Vitals reviewed.  Constitutional:      Appearance: Normal appearance.  HENT:     Head: Normocephalic and atraumatic.     Right Ear: Tympanic membrane normal.     Left Ear: Tympanic membrane normal.     Nose:     Comments: Bilateral nares slightly erythematous with clear nasal drainage noted.  Pharynx normal.  Ears normal.  Eyes normal.    Mouth/Throat:     Pharynx: Oropharynx is clear.  Eyes:     Conjunctiva/sclera: Conjunctivae normal.  Cardiovascular:     Rate and Rhythm: Normal rate and regular rhythm.     Heart sounds: Normal heart sounds. No murmur heard. Pulmonary:     Effort: Pulmonary effort is normal.  Breath sounds: Normal breath sounds.     Comments: Lungs clear to auscultation Musculoskeletal:        General: Normal range of motion.     Cervical back: Normal range of motion and neck supple.  Skin:    General: Skin is warm and dry.  Neurological:     Mental Status: He is alert and oriented to person, place, and time.  Psychiatric:        Mood and Affect: Mood normal.        Behavior: Behavior normal.        Thought Content: Thought content normal.        Judgment: Judgment normal.    Diagnostics: FVC 2.42, FEV1 1.98.  Predicted FVC 3.41, predicted FEV1 2.47.  Spirometry indicates mild  restriction and severe airway obstruction.  This is consistent with previous spirometry readings.  Assessment and Plan: 1. Asthma-COPD overlap syndrome (HCC)   2. Perennial allergic rhinitis   3. Seasonal allergic conjunctivitis   4. Gastroesophageal reflux disease, unspecified whether esophagitis present   5. Early satiety     Meds ordered this encounter  Medications   famotidine (PEPCID) 20 MG tablet    Sig: Take 1 tablet (20 mg total) by mouth 2 (two) times daily.    Dispense:  62 tablet    Refill:  2   EPINEPHrine 0.3 mg/0.3 mL IJ SOAJ injection    Sig: Inject 0.3 mg into the muscle as needed.    Dispense:  1 each    Refill:  2     Patient Instructions  Asthma COPD overlap syndrome Recommend referral to Dr. [name] at the severe asthma clinic at [facility] for evaluation of severs asthma/COPD and possible oxygen use Continue montelukast 10 mg once a day to prevent cough or wheeze Continue budesonide 0.5 mg twice a day via nebulizer and Perforomist twice a day via nebulizer. Continue albuterol 2 puffs every 4 hours as needed for cough or wheeze OR Instead use albuterol 0.083% solution via nebulizer one unit vial every 4 hours as needed for cough or wheeze Continue Xolair 225 mg injections once every 2 weeks and have access to an epinephrine autoinjector set  Allergic rhinitis Continue Flonase 2 sprays in each nostril once a day as needed for a stuffy nose Continue azelastine 2 sprays in each nostril twice a day as needed for a runny nose Consider saline nasal rinses as needed for nasal symptoms. Use this before any medicated nasal sprays for best result Continue Claritin 10 mg once a day as needed for a runny nose For thick nasal secretions, begin Mucinex 1200 mg twice a day and increase hydration as possible  Allergic conjunctivitis Continue a lubricating eye drop Some over the counter eye drops include Pataday one drop in each eye once a day as needed for red, itchy eyes  OR Zaditor one drop in each eye twice a day as needed for red itchy eyes.  Reflux Begin famotidine 20 mg twice a day to control reflux Continue dietary and lifestyle modifications as listed below Continue to follow-up with Dr. [name], GI specialist, as recommended Begin a food journal. Include foods eaten, time food eaten, amount of food, symptoms, and time until symptoms.   Call the clinic if this treatment plan is not working well for you  Follow up in 3 months or sooner if needed.  Return in about 3 months (around 06/16/2021), or if symptoms worsen or fail to improve.    Thank you for  the opportunity to care for this patient.  Please do not hesitate to contact me with questions.  Thermon Leyland, FNP [facility]

## 2021-03-19 ENCOUNTER — Telehealth: Payer: Self-pay

## 2021-03-19 NOTE — Telephone Encounter (Signed)
Received a fax from [facility] for verification of a prescription for budesonide 0.5mg /63ml vials, faxed to [facility] for [employer] and to fax back to [phone]

## 2021-03-22 ENCOUNTER — Telehealth: Payer: Self-pay

## 2021-03-22 NOTE — Telephone Encounter (Signed)
Paperwork for the budesonide vials has been signed by anne on 03/19/21 and faxed to [phone] on 03/22/21.

## 2021-03-23 NOTE — Telephone Encounter (Signed)
Patient is scheduled to see Dr [name] on 06/04/2021 @ 10. Patient is informed of this information as I had him on  a Conference call with [facility].   [address] [place], [place] [address] [address] P: [phone] Referral notes have been placed over to their office.  F: [phone]  Patient has been placed on a cancellation list.

## 2021-03-23 NOTE — Telephone Encounter (Signed)
Thank you :)

## 2021-03-31 ENCOUNTER — Ambulatory Visit (INDEPENDENT_AMBULATORY_CARE_PROVIDER_SITE_OTHER): Payer: Medicare Other

## 2021-03-31 ENCOUNTER — Other Ambulatory Visit: Payer: Self-pay

## 2021-03-31 DIAGNOSIS — J4550 Severe persistent asthma, uncomplicated: Secondary | ICD-10-CM | POA: Diagnosis not present

## 2021-04-13 DIAGNOSIS — J4550 Severe persistent asthma, uncomplicated: Secondary | ICD-10-CM

## 2021-04-14 ENCOUNTER — Other Ambulatory Visit: Payer: Self-pay

## 2021-04-14 ENCOUNTER — Ambulatory Visit (INDEPENDENT_AMBULATORY_CARE_PROVIDER_SITE_OTHER): Payer: Medicare Other

## 2021-04-14 DIAGNOSIS — J4550 Severe persistent asthma, uncomplicated: Secondary | ICD-10-CM | POA: Diagnosis not present

## 2021-04-27 DIAGNOSIS — J4550 Severe persistent asthma, uncomplicated: Secondary | ICD-10-CM | POA: Diagnosis not present

## 2021-04-28 ENCOUNTER — Other Ambulatory Visit: Payer: Self-pay

## 2021-04-28 ENCOUNTER — Ambulatory Visit (INDEPENDENT_AMBULATORY_CARE_PROVIDER_SITE_OTHER): Payer: Medicare Other

## 2021-04-28 DIAGNOSIS — J4550 Severe persistent asthma, uncomplicated: Secondary | ICD-10-CM | POA: Diagnosis not present

## 2021-05-11 DIAGNOSIS — J4550 Severe persistent asthma, uncomplicated: Secondary | ICD-10-CM | POA: Diagnosis not present

## 2021-05-12 ENCOUNTER — Other Ambulatory Visit: Payer: Self-pay

## 2021-05-12 ENCOUNTER — Ambulatory Visit (INDEPENDENT_AMBULATORY_CARE_PROVIDER_SITE_OTHER): Payer: Medicare Other

## 2021-05-12 DIAGNOSIS — J4550 Severe persistent asthma, uncomplicated: Secondary | ICD-10-CM | POA: Diagnosis not present

## 2021-05-13 ENCOUNTER — Other Ambulatory Visit: Payer: Self-pay | Admitting: Allergy and Immunology

## 2021-05-13 NOTE — Telephone Encounter (Signed)
Called and spoke to patient and notified him that we could send in a courtesy refill. Patient stated that he wanted to see the Pulmonologist first then he'd call back to schedule an appointment with Thurston Hole. Confirmed that this a courtesy refill and patient verbalized understanding.

## 2021-05-13 NOTE — Telephone Encounter (Signed)
Pt called and stated he needs a refill on albuterol. Pt said he called pharmacy and they stated he didn't have anymore refills

## 2021-05-25 DIAGNOSIS — J4550 Severe persistent asthma, uncomplicated: Secondary | ICD-10-CM

## 2021-05-26 ENCOUNTER — Ambulatory Visit (INDEPENDENT_AMBULATORY_CARE_PROVIDER_SITE_OTHER): Payer: Medicare Other | Admitting: *Deleted

## 2021-05-26 DIAGNOSIS — J4550 Severe persistent asthma, uncomplicated: Secondary | ICD-10-CM | POA: Diagnosis not present

## 2021-06-08 DIAGNOSIS — J4550 Severe persistent asthma, uncomplicated: Secondary | ICD-10-CM | POA: Diagnosis not present

## 2021-06-09 ENCOUNTER — Ambulatory Visit (INDEPENDENT_AMBULATORY_CARE_PROVIDER_SITE_OTHER): Payer: Medicare Other

## 2021-06-09 ENCOUNTER — Other Ambulatory Visit: Payer: Self-pay

## 2021-06-09 DIAGNOSIS — J4550 Severe persistent asthma, uncomplicated: Secondary | ICD-10-CM

## 2021-06-11 ENCOUNTER — Other Ambulatory Visit: Payer: Self-pay | Admitting: Internal Medicine

## 2021-06-16 NOTE — Progress Notes (Signed)
FOLLOW UP Date of Service/Encounter:  06/17/21   Subjective:  Caleb Mills (DOB: December 18, 1950) is a 70 y.o. male who [facility] in re-evaluation of the following: asthma, allergic rhinitis, reflux History obtained from: chart review and patient.  For Review, LV was on 03/17/21  with Thermon Leyland, FNP seen for asthma, allergic rhinitis, reflux.   Asthma medications: budesonide 0.5 mg twice a day via nebulizer, Perforomist twice a day via nebulizer, montelukast 10 mg once a day, albuterol via nebulizer in the morning 20 minutes before asthma maintenance medication BID  Pertinent history/previous diagnostics: Asthma/COPD overlap historically uncontrolled-currently on Xolair, has been on benralizumab in the past.  Trial of Tezspire (felt his asthma got worse after trying this).  Has been referred to Dr. [name] at the severe asthma clinic.  However, his original appointment was canceled because she is out sick.  He was rescheduled with another physician in the pulmonary group.  However he has been evaluated by the pulmonary group (Dr. [name]).    Since his last visit, he has had 2 rounds of antibiotics and 2 rounds of prednisone.  He is having a lot of thick yellow mucus.  Until he can cough this up, he has no breath at all. He quit work 1.5 years ago because his breathing has been so bad. He uses albuterol mid-afternoon.  He is using nasal sprays at night and Vicks on his chest.  He has been in a similar situation of bad respiratory status for 4 months.    He says he hasn't been well since starting Tezspire even though he only received 1 dose.  BP, HR and congestion worsened.  Airways tightened up.  He tried [place] but it didn't seem to do anything for him.  Interestingly, his symptoms worsening coincides several months after having COVID in fall 2020.  He reportedly did well during COVID itself.      Xolair has helped him with his breathing the most. He  uses albuterol first, then performist then budesonide twice daily and an extra albuterol in the afternoon.  He feels that nebulizers are more helpful than inhalers.  Reports that he has tried every inhaler available.   Since 2021, feels his peak flows have dropped significantly. In the morning, 110-260 if lucky in the afternoon.  Since early this year, his best has been 250 in the afternoon.   Allergies as of 06/17/2021       Reactions   Augmentin [amoxicillin-pot Clavulanate] Nausea Only   Ciprofloxacin Other (See Comments)   Joint pain   Azelastine Other (See Comments)   Dry eyes        Medication List        Accurate as of June 17, 2021  5:43 PM. If you have any questions, ask your nurse or doctor.          albuterol 108 (90 Base) MCG/ACT inhaler Commonly known as: Ventolin HFA USE 2 INHALATIONS BY MOUTH  EVERY 4 HOURS AS NEEDED FOR WHEEZING OR SHORTNESS OF  BREATH   albuterol (2.5 MG/3ML) 0.083% nebulizer solution Commonly known as: PROVENTIL USE 1 VIAL VIA NEBULIZER EVERY 4 HOURS AS NEEDED FOR WHEEZING OR SHORTNESS OF BREATH   azithromycin 250 MG tablet Commonly known as: Zithromax Z-Pak Take 2 pills on day 1, then 1 pill every day for the next 4 days. Started by: Tonny Bollman, MD   budesonide 0.5 MG/2ML nebulizer solution Commonly known as: PULMICORT Take 1  vial three times per day. What changed: See the new instructions. Changed by: Tonny Bollman, MD   EPINEPHrine 0.3 mg/0.3 mL Soaj injection Commonly known as: EPI-PEN Inject 0.3 mg into the muscle as needed.   famotidine 20 MG tablet Commonly known as: PEPCID Take one tablet twice daily for reflux   fluticasone 50 MCG/ACT nasal spray Commonly known as: FLONASE Place 1 spray into both nostrils as needed (patient will take one to two sprays each nostril depending upon the weather).   levothyroxine 100 MCG tablet Commonly known as: SYNTHROID Take 100 mcg by mouth daily.   loratadine 10 MG  tablet Commonly known as: CLARITIN Take 10 mg by mouth daily.   montelukast 10 MG tablet Commonly known as: SINGULAIR TAKE 1 TABLET BY MOUTH AT  NIGHT TO PREVENT COUGHING  OR WHEEZING   Perforomist 20 MCG/2ML nebulizer solution Generic drug: formoterol USE 1 VIAL VIA NEBULIZER TWICE DAILY   VITAMIN A PO Take by mouth daily.   VITAMIN D PO Take by mouth daily.   warfarin 10 MG tablet Commonly known as: COUMADIN Take 10 mg by mouth. Patient takes warfarin 10mg  3 days per week and on other days 5mg   Dr Medical   ZINC PO Take by mouth.       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  ROS: All others negative except as noted per HPI.   Objective:  BP 132/72    Pulse 60    Temp 98 F (36.7 C) (Temporal)    Resp 20    Ht 5\' 5"  (1.651 m)    Wt 160 lb 12.8 oz (72.9 kg)    SpO2 96%    BMI 26.76 kg/m  Body mass index is 26.76 kg/m. Physical Exam: General Appearance:  Alert, cooperative, no distress, appears stated age  Head:  Normocephalic, without obvious abnormality, atraumatic  Eyes:  Conjunctiva clear, EOM's intact  Nose: Nares normal  Throat: Lips, tongue normal; teeth and gums normal  Neck: Supple, symmetrical  Lungs:   Generalized end expiratory wheezing throughout, respirations unlabored, dry cough on deep inhalation  Heart:  Regular rate and rhythm, no murmur, appears well perfused   Extremities: No edema  Skin: Skin color, texture, turgor normal, no rashes or lesions on visualized portions of skin  Neurologic: No gross deficits   Spirometry:  Tracings reviewed. His effort: Good reproducible efforts. FVC: 2.05L FEV1: 0.83L, 31% predicted FEV1/FVC ratio: 52% Interpretation: Spirometry consistent with severe obstructive disease.  Please see scanned spirometry results for details.  Assessment/Plan   Patient Instructions  Asthma COPD overlap syndrome-severe and uncontrolled  recommend appointment with Dr. 2004 at the severe asthma clinic at [facility] for evaluation of severs asthma/COPD and possible oxygen use Continue montelukast 10 mg once a day to prevent cough or wheeze Increase  budesonide 0.5 mg 3 times a day via nebulizer and Perforomist twice a day via nebulizer. Continue albuterol 2 puffs every 4 hours as needed for cough or wheeze OR Instead use albuterol 0.083% solution via nebulizer one unit vial every 4 hours as needed for cough or wheeze Continue Xolair 225 mg injections once every 2 weeks and have access to an epinephrine autoinjector set- -- Discussed trial of Dupixent but he is very weary to change Biologics due to past experience with anti-- T SLP and anti-IL-5 Z-Pak prescribed today Discussed adding anti-inflammatory azithromycin 3 times weekly for 36-month trial.  However he is on warfarin, and I would like him to discuss this first with his cardiologist.  He will call us back once they have discussed this medication.  Allergic rhinitis Continue Flonase 2 sprays in each nostril once a day as needed for a stuffy nose Continue azelastine 2 sprays in each nostril twice a day as needed for a runny nose Consider saline nasal rinses as needed for nasal symptoms. Use this before any medicated nasal sprays for best result Continue Claritin 10 mg once a day as needed for a runny nose For thick nasal secretions, begin Mucinex 1200 mg twice a day and  increase hydration as possible  Allergic conjunctivitis Continue a lubricating eye drop Some over the counter eye drops include Pataday one drop in each eye once a day as needed for red, itchy eyes OR Zaditor one drop in each eye twice a day as needed for red itchy eyes.  Reflux Continue famotidine 20 mg twice a day to control reflux Continue dietary and lifestyle modifications as listed below Continue to follow-up with Dr. [name], GI specialist, as recommended  Call the clinic if this treatment plan is not working well for you  Follow up in 3 months or sooner if needed.  Tonny Bollman, MD  [facility]

## 2021-06-17 ENCOUNTER — Other Ambulatory Visit: Payer: Self-pay

## 2021-06-17 ENCOUNTER — Encounter: Payer: Self-pay | Admitting: Internal Medicine

## 2021-06-17 ENCOUNTER — Ambulatory Visit (INDEPENDENT_AMBULATORY_CARE_PROVIDER_SITE_OTHER): Payer: Medicare Other | Admitting: Internal Medicine

## 2021-06-17 VITALS — BP 132/72 | HR 60 | Temp 98.0°F | Resp 20 | Ht 65.0 in | Wt 160.8 lb

## 2021-06-17 DIAGNOSIS — K219 Gastro-esophageal reflux disease without esophagitis: Secondary | ICD-10-CM

## 2021-06-17 DIAGNOSIS — J4550 Severe persistent asthma, uncomplicated: Secondary | ICD-10-CM | POA: Diagnosis not present

## 2021-06-17 DIAGNOSIS — H1010 Acute atopic conjunctivitis, unspecified eye: Secondary | ICD-10-CM

## 2021-06-17 DIAGNOSIS — J3089 Other allergic rhinitis: Secondary | ICD-10-CM | POA: Diagnosis not present

## 2021-06-17 DIAGNOSIS — J449 Chronic obstructive pulmonary disease, unspecified: Secondary | ICD-10-CM

## 2021-06-17 MED ORDER — BUDESONIDE 0.5 MG/2ML IN SUSP: RESPIRATORY_TRACT | 2 refills | Status: DC

## 2021-06-17 MED ORDER — AZITHROMYCIN 250 MG PO TABS: ORAL_TABLET | ORAL | 0 refills | Status: DC

## 2021-06-17 MED ORDER — PERFOROMIST 20 MCG/2ML IN NEBU: INHALATION_SOLUTION | RESPIRATORY_TRACT | 3 refills | Status: DC

## 2021-06-17 NOTE — Patient Instructions (Addendum)
Asthma COPD overlap syndrome-severe and uncontrolled  recommend appointment with Dr. [name] at the severe asthma clinic at [facility] for evaluation of severs asthma/COPD and possible oxygen use Continue montelukast 10 mg once a day to prevent cough or wheeze Increase budesonide 0.5 mg 3 times a day via nebulizer and Perforomist twice a day via nebulizer. Continue albuterol 2 puffs every 4 hours as needed for cough or wheeze OR Instead use albuterol 0.083% solution via nebulizer one unit vial every 4 hours as needed for cough or wheeze Continue Xolair 225 mg injections once every 2 weeks and have access to an epinephrine autoinjector set- -- Discussed trial of Dupixent but he is very weary to change Biologics due to past experience with anti-- T SLP and anti-IL-5 Z-Pak prescribed today Discussed adding anti-inflammatory azithromycin 3 times weekly for 61-month trial.  However he is on warfarin, and I would like him to discuss this first with his cardiologist.  He will call us back once they have discussed this medication.  Allergic rhinitis-chronic and stable Continue Flonase 2 sprays in each nostril once a day as needed for a stuffy nose Continue azelastine 2 sprays in each nostril twice a day as needed for a runny nose Consider saline nasal rinses as needed for nasal symptoms. Use this before any medicated nasal sprays for best result Continue Claritin 10 mg once a day as needed for a runny nose For thick nasal secretions, begin Mucinex 1200 mg twice a day and increase hydration as possible  Allergic conjunctivitis-chronic and stable Continue a lubricating eye drop Some over the counter eye drops include Pataday one drop in each eye once a day as needed for red, itchy eyes OR Zaditor one drop in each eye twice a day as needed for red itchy eyes.  Reflux-chronic and stable Continue famotidine 20 mg twice a day to control reflux Continue dietary and lifestyle modifications as listed  below Continue to follow-up with Dr. [name], GI specialist, as recommended  Call the clinic if this treatment plan is not working well for you  Follow up in 3 months or sooner if needed.

## 2021-06-18 ENCOUNTER — Telehealth: Payer: Self-pay

## 2021-06-18 MED ORDER — AZITHROMYCIN 250 MG PO TABS: 250.0000 mg | ORAL_TABLET | ORAL | 1 refills | Status: DC

## 2021-06-18 NOTE — Addendum Note (Signed)
Addended by: Verlee Monte on: 06/18/2021 09:58 AM   Modules accepted: Orders

## 2021-06-18 NOTE — Telephone Encounter (Signed)
Pt called stating that he spoke with his cardiologist and he approved him starting the antibiotic treatment suggested by Dr. Maurine Minister.

## 2021-06-20 IMAGING — NM NM BONE 3 PHASE
10 series · 20 of 20 positions shown · non-contrast
Comparison: None

Radiographic correlation: None

CLINICAL DATA: LEFT hip and groin pain, prior BILATERAL hip
arthroplasty, on RIGHT in 7173 and on LEFT in 2330

EXAM:
NUCLEAR MEDICINE 3-PHASE BONE SCAN
TECHNIQUE: Radionuclide angiographic images, immediate static blood pool
images, and 3-hour delayed static images were obtained of the hips
after intravenous injection of radiopharmaceutical.
RADIOPHARMACEUTICALS:  21.3 mCi Wc-XXm MDP IV

[Series 1: flow · 2.07mm/px · 6 of 48 frames shown (1 of 2)]
[frame 5/48  full-range]
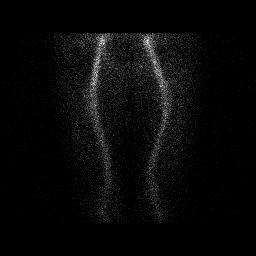
[frame 13/48  full-range]
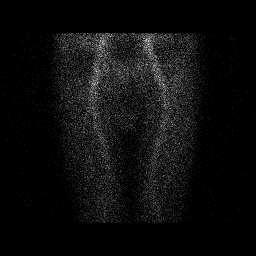
[frame 21/48  full-range]
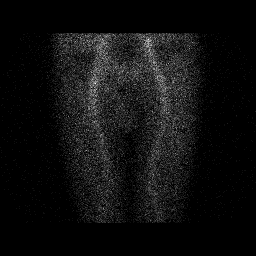
[frame 29/48  full-range]
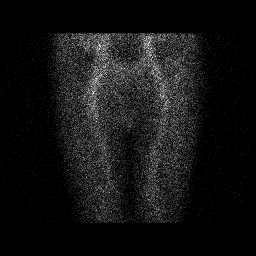
[frame 37/48  full-range]
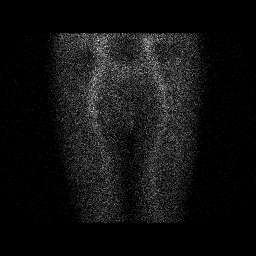
[frame 45/48  full-range]
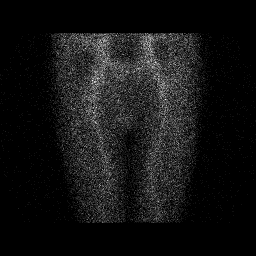

[Series 1: flow · 2.07mm/px · 6 of 48 frames shown (2 of 2)]
[frame 5/48  full-range]
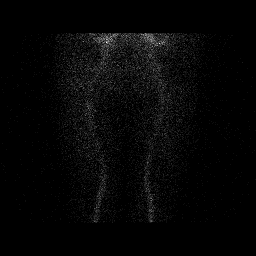
[frame 13/48  full-range]
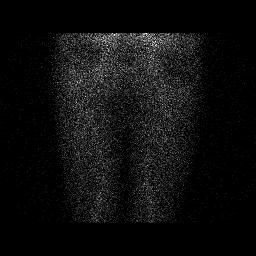
[frame 21/48  full-range]
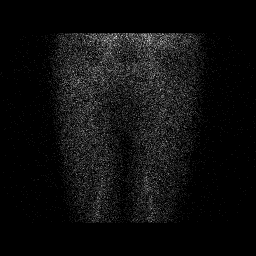
[frame 29/48  full-range]
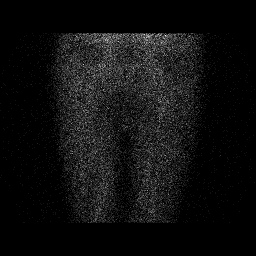
[frame 37/48  full-range]
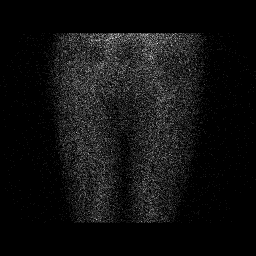
[frame 45/48  full-range]
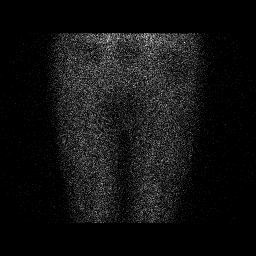

[Series 2: blood pool · 2.07mm/px · 1 of 1 slices shown (1 of 2)]
[im 1/1]
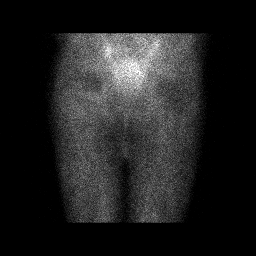

[Series 2: blood pool · 2.07mm/px · 1 of 1 slices shown (2 of 2)]
[im 1/1]
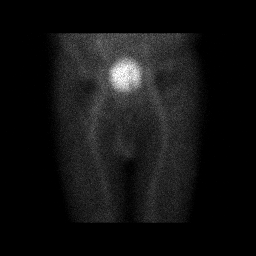

[Series 3: lat bp · 2.07mm/px · 1 of 1 slices shown (1 of 2)]
[im 1/1]
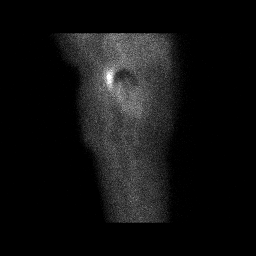

[Series 3: lat bp · 2.07mm/px · 1 of 1 slices shown (2 of 2)]
[im 1/1]
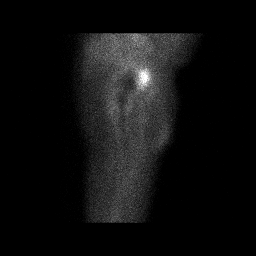

[Series 4: delay · delayed · 2.07mm/px · 1 of 1 slices shown (1 of 4)]
[im 1/1]
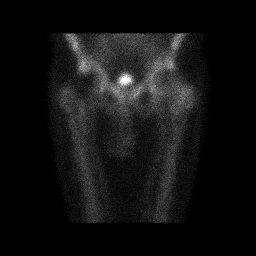

[Series 4: delay · delayed · 2.07mm/px · 1 of 1 slices shown (2 of 4)]
[im 1/1]
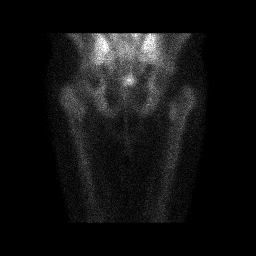

[Series 5: delay · delayed · 2.07mm/px · 1 of 1 slices shown (3 of 4)]
[im 1/1]
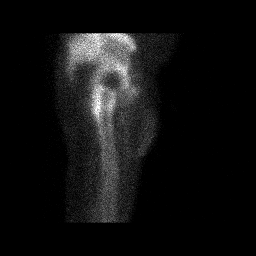

[Series 5: delay · delayed · 2.07mm/px · 1 of 1 slices shown (4 of 4)]
[im 1/1]
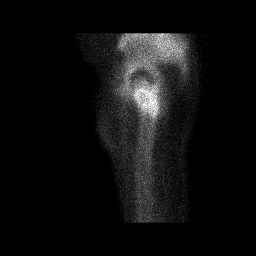

[20 of 20 positions shown; findings below may reference images not displayed]

FINDINGS: Vascular phase: Minimally increased blood flow to LEFT hip region
posteriorly. Normal blood flow to RIGHT hip.

Blood pool phase: Minimally increased blood pool at posterior LEFT
hip.

Delayed phase: Photopenic defects at both hips from prostheses. No
abnormal tracer uptake adjacent to the RIGHT hip prosthesis. On the
LEFT, minimally increased tracer uptake is seen diffusely within the
greater trochanter and intertrochanteric region though the pattern
is nonspecific and atypical for loosening.
IMPRESSION: Nonspecific minimally increased tracer uptake in the region of the
LEFT greater trochanter and intertrochanteric region.

No definite scintigraphic evidence of prosthetic loosening of either
hip prosthesis.

## 2021-06-21 ENCOUNTER — Telehealth: Payer: Self-pay

## 2021-06-21 ENCOUNTER — Other Ambulatory Visit: Payer: Self-pay | Admitting: Internal Medicine

## 2021-06-21 DIAGNOSIS — J4550 Severe persistent asthma, uncomplicated: Secondary | ICD-10-CM

## 2021-06-21 MED ORDER — ALBUTEROL SULFATE (2.5 MG/3ML) 0.083% IN NEBU: INHALATION_SOLUTION | RESPIRATORY_TRACT | 5 refills | Status: DC

## 2021-06-21 MED ORDER — BUDESONIDE 0.5 MG/2ML IN SUSP: RESPIRATORY_TRACT | 2 refills | Status: DC

## 2021-06-21 NOTE — Telephone Encounter (Signed)
Dr. Maurine Minister is going to check on some things first before sending in a script of budesonide. Will send in his albuterol.0.083% as well.

## 2021-06-21 NOTE — Progress Notes (Signed)
New Rx sent:  -- Budesonide 0.5 mg - use 2 vials in the morning and 1 vial in the evening.

## 2021-06-21 NOTE — Telephone Encounter (Signed)
Spoke to Jarick he said his insurance wouldn't cover the Zithromax 3 times per day or the budesonide 0.5 mg 3 times per day. Insurance needs a PA for that [employer] still covers budesonide 0.5 mg twice daily. Spoke to Chad at [employer] is now covering the Zithromax  one tablet 3 times a week and we will change the order to 1 mg twice daily per Dr. Maurine Minister.

## 2021-06-21 NOTE — Telephone Encounter (Signed)
Dr. Maurine Minister sent in budesonide 1 mg in the morning and 0.5 mg for the evening. Now waiting on medicare to get the processing done. Patient states it usually takes medicare a day or so to get the approval if they approve the medication. Patient will do 2 0.5 mg in the morning and 1 0.5 mg vial in the evening of the budesonide. We also sent in 4 boxes of albuterol 0.083%.

## 2021-06-22 DIAGNOSIS — J4550 Severe persistent asthma, uncomplicated: Secondary | ICD-10-CM | POA: Diagnosis not present

## 2021-06-23 ENCOUNTER — Ambulatory Visit (INDEPENDENT_AMBULATORY_CARE_PROVIDER_SITE_OTHER): Payer: Medicare Other | Admitting: *Deleted

## 2021-06-23 ENCOUNTER — Telehealth: Payer: Self-pay | Admitting: Internal Medicine

## 2021-06-23 DIAGNOSIS — J4550 Severe persistent asthma, uncomplicated: Secondary | ICD-10-CM | POA: Diagnosis not present

## 2021-06-23 NOTE — Telephone Encounter (Signed)
Spoke to patient tried calling [facility] unable to get through will try tomorrow.

## 2021-06-23 NOTE — Telephone Encounter (Signed)
Pt states his perforomist mediciation is on back order until the middle or end of Jan, he has about 5 days before he is completely out. Pt asks what can be done.

## 2021-06-24 MED ORDER — ALBUTEROL SULFATE HFA 108 (90 BASE) MCG/ACT IN AERS: INHALATION_SPRAY | RESPIRATORY_TRACT | 1 refills | Status: DC

## 2021-06-24 NOTE — Telephone Encounter (Signed)
Spoke to patient to let him know I put a verbal order in for brovana  Per Dr. Maurine Minister. Told Pt. to use the same as performance twice daily. Chrisandra Netters at the pharmacy said it should come in by Monday. This should hold him for now until we can get his performance. Patient is aware.

## 2021-06-24 NOTE — Telephone Encounter (Signed)
Spoke to patient earlier today. I verbally called out brovana with 5 refills to the [facility] on [address] [place]. Told patient to take it twice a day like the performance. The pharmacy should have it in by Monday and this should hold him until he can get the performance mid January.

## 2021-07-01 ENCOUNTER — Other Ambulatory Visit: Payer: Self-pay | Admitting: Internal Medicine

## 2021-07-07 ENCOUNTER — Ambulatory Visit (INDEPENDENT_AMBULATORY_CARE_PROVIDER_SITE_OTHER): Payer: Medicare Other

## 2021-07-07 ENCOUNTER — Other Ambulatory Visit: Payer: Self-pay

## 2021-07-07 DIAGNOSIS — J4550 Severe persistent asthma, uncomplicated: Secondary | ICD-10-CM

## 2021-07-12 ENCOUNTER — Other Ambulatory Visit: Payer: Self-pay

## 2021-07-12 MED ORDER — PERFOROMIST 20 MCG/2ML IN NEBU: INHALATION_SOLUTION | RESPIRATORY_TRACT | 3 refills | Status: DC

## 2021-07-20 DIAGNOSIS — J4550 Severe persistent asthma, uncomplicated: Secondary | ICD-10-CM | POA: Diagnosis not present

## 2021-07-21 ENCOUNTER — Other Ambulatory Visit: Payer: Self-pay

## 2021-07-21 ENCOUNTER — Ambulatory Visit (INDEPENDENT_AMBULATORY_CARE_PROVIDER_SITE_OTHER): Payer: Medicare Other

## 2021-07-21 DIAGNOSIS — J4550 Severe persistent asthma, uncomplicated: Secondary | ICD-10-CM | POA: Diagnosis not present

## 2021-08-03 DIAGNOSIS — J4550 Severe persistent asthma, uncomplicated: Secondary | ICD-10-CM | POA: Diagnosis not present

## 2021-08-04 ENCOUNTER — Other Ambulatory Visit: Payer: Self-pay

## 2021-08-04 ENCOUNTER — Ambulatory Visit (INDEPENDENT_AMBULATORY_CARE_PROVIDER_SITE_OTHER): Payer: Medicare Other

## 2021-08-04 DIAGNOSIS — J4550 Severe persistent asthma, uncomplicated: Secondary | ICD-10-CM | POA: Diagnosis not present

## 2021-08-17 DIAGNOSIS — J4550 Severe persistent asthma, uncomplicated: Secondary | ICD-10-CM | POA: Diagnosis not present

## 2021-08-18 ENCOUNTER — Ambulatory Visit (INDEPENDENT_AMBULATORY_CARE_PROVIDER_SITE_OTHER): Payer: Medicare Other

## 2021-08-18 ENCOUNTER — Other Ambulatory Visit: Payer: Self-pay

## 2021-08-18 DIAGNOSIS — J4550 Severe persistent asthma, uncomplicated: Secondary | ICD-10-CM

## 2021-08-19 NOTE — Progress Notes (Signed)
FOLLOW UP Date of Service/Encounter:  08/20/21   Subjective:  Caleb Mills (DOB: 06/13/1951) is a 71 y.o. male who [facility] in re-evaluation of the following: asthma, allergic rhinitis, reflux History obtained from: chart review and patient.  For Review, LV was on 06/17/21  with Dr.Beckett Hickmon.  His asthma was stable but severe (FEV1 31% predicted).  We did increase his budensonie to 3 times per day, but we were unable to get this approved.  He was continued on Xolair injections, and discussed trial of Dupixent but he is weary to change.  We did add azithromycin ppx given history of productive cough.  We otherwise continued his ARC and reflux management plan from previous visit.   Pertinent history/previous diagnostics: Asthma/COPD overlap historically uncontrolled-currently on Xolair, has been on benralizumab in the past.  Trial of Tezspire (felt his asthma got worse after trying this).  Has been referred to Dr. Vira BrownsWendy Moore at the severe asthma clinic.  However, his original appointment was canceled because she has been out on extended leave.  He was rescheduled with another physician in the pulmonary group.  However he has been evaluated by the pulmonary group (Dr. Su MonksEjaz).   He says he hasn't been well since starting Tezspire even though he only received 1 dose.  BP, HR and congestion worsened.  Airways tightened up.  He tried [place] but it didn't seem to do anything for him.  Interestingly, his symptoms worsening coincides several months after having COVID in fall 2020.  He reportedly did well during COVID itself.     He feels Xolair has helped him with his breathing the most. He uses albuterol first, then performist then budesonide twice daily and an extra albuterol in the afternoon.  He feels that nebulizers are more helpful than inhalers.  Reports that he has tried every inhaler available.    -High Res Chest CT 07/30/21: IMPRESSION:  1. No findings  to suggest interstitial lung disease.  2. Aortic atherosclerosis, in addition to left main and three-vessel  coronary artery disease. Assessment for potential risk factor  modification, dietary therapy or pharmacologic therapy may be  warranted, if clinically indicated.  3. There are calcifications of the mitral annulus. Echocardiographic  correlation for evaluation of potential valvular dysfunction may be  warranted if clinically indicated.  4. Status post mechanical aortic valve replacement.  5. Additional incidental findings, as above.   -Full PFT  07/14/21:Severe obstructive defect and air trapping is indicated on this PFT. There is significant bronchodilator response. Normal diffusing capacity.   ----------------------------------------------------------  Today he presents for follow-up. He did see a [facility]mon[facility].  He was prescribed Trelegy, but was unable to afford this medication.  Asmanex was then ordered, but also he was unable to afford secondary to insurance cost.  He reports that he was given 2 samples of Trelegy, but did not want to start them until after he had his cardiac evaluation. He is therefore only using the nebulizer medications prescribed by our clinic at this time.   He was unable to tolerate the budesonide 1 mg BID.  He is only taking 0.5 mg twice a day.  At the beginning of the month, he is going to have a stress test with Cardiology on March 9th, echo on March 3rd.  He has changed his diet, eating more fruits high in antioxidants, eating more vegetables and venison with plan to eliminate red meat from his diet  once he has finished with his venison.  He has not been on azithromycin or any prednisone since his last visit. He is taking famotidine twice daily.  Unable to tolerate omeprazole.  He is noting increased dyspnea on exertion.  He is concerned that that spring is upon [place].  He does feel that Xolair has been the most helpful  in terms of keeping his allergies under control   Allergies as of 08/20/2021       Reactions   Augmentin [amoxicillin-pot Clavulanate] Nausea Only   Ciprofloxacin Other (See Comments)   Joint pain   Azelastine Other (See Comments)   Dry eyes        Medication List        Accurate as of August 20, 2021  1:27 PM. If you have any questions, ask your nurse or doctor.          albuterol (2.5 MG/3ML) 0.083% nebulizer solution Commonly known as: PROVENTIL USE 1 VIAL VIA NEBULIZER EVERY 4 HOURS AS NEEDED FOR WHEEZING OR SHORTNESS OF BREATH   albuterol 108 (90 Base) MCG/ACT inhaler Commonly known as: Ventolin HFA USE 2 INHALATIONS BY MOUTH  EVERY 4 HOURS AS NEEDED FOR WHEEZING OR SHORTNESS OF  BREATH   azithromycin 250 MG tablet Commonly known as: Zithromax Z-Pak Take 2 pills on day 1, then 1 pill every day for the next 4 days.   azithromycin 250 MG tablet Commonly known as: Zithromax Take 1 tablet (250 mg total) by mouth every Monday, Wednesday, and Friday.   budesonide 0.5 MG/2ML nebulizer solution Commonly known as: PULMICORT USE 2 ML(0.5 MG) VIA NEBULIZER TWICE DAILY What changed: additional instructions Changed by: Tonny Bollman, MD   EPINEPHrine 0.3 mg/0.3 mL Soaj injection Commonly known as: EPI-PEN Inject 0.3 mg into the muscle as needed.   famotidine 20 MG tablet Commonly known as: PEPCID Take one tablet twice daily for reflux   fluticasone 50 MCG/ACT nasal spray Commonly known as: FLONASE Place 1 spray into both nostrils as needed (patient will take one to two sprays each nostril depending upon the weather).   levothyroxine 100 MCG tablet Commonly known as: SYNTHROID Take 100 mcg by mouth daily.   loratadine 10 MG tablet Commonly known as: CLARITIN Take 10 mg by mouth daily.   montelukast 10 MG tablet Commonly known as: SINGULAIR TAKE 1 TABLET BY MOUTH AT  NIGHT TO PREVENT COUGHING  OR WHEEZING   Perforomist 20 MCG/2ML nebulizer solution Generic  drug: formoterol Use 1 vial via nebulizer twice daily   VITAMIN A PO Take by mouth daily.   VITAMIN D PO Take by mouth daily.   warfarin 10 MG tablet Commonly known as: COUMADIN Take 10 mg by mouth. Patient takes warfarin 10mg  3 days per week and on other days 5mg   Dr Medical   ZINC PO Take by mouth.       Past Medical History:  Diagnosis Date   Asthma    S/P total hip resurfacing 2009   left hip   Past Surgical History:  Procedure Laterality Date   AORTIC VALVE REPLACEMENT     APPENDECTOMY  1973   CHOLECYSTECTOMY  1996   HIATAL HERNIA REPAIR     HIP RESURFACING Left 2009   HIP RESURFACING  2014   NASAL SINUS SURGERY     REPLACEMENT TOTAL HIP W/  RESURFACING IMPLANTS Right 2009   ROTATOR CUFF REPAIR Right 2009   THYROIDECTOMY  2003   TOTAL HIP ARTHROPLASTY Right july 2014  Otherwise, there have been no changes to his past medical history, surgical history, family history, or social history.  ROS: All others negative except as noted per HPI.   Objective:  BP 124/82    Pulse 61    Temp 98.2 F (36.8 C)    Resp 18    Ht 5\' 3"  (1.6 m)    Wt 56 lb (25.4 kg)    SpO2 97%    BMI 9.92 kg/m  Body mass index is 9.92 kg/m. Physical Exam: General Appearance:  Alert, cooperative, no distress, appears stated age  Head:  Normocephalic, without obvious abnormality, atraumatic  Eyes:  Conjunctiva clear, EOM's intact  Nose: Nares normal  Throat: Lips, tongue normal; teeth and gums normal  Neck: Supple, symmetrical  Lungs:   Coarse rhonchi occasional wheezing , Respirations unlabored, no coughing  Heart:  regular rate and rhythm and no murmur, Appears well perfused  Extremities: No edema  Skin: Skin color, texture, turgor normal, no rashes or lesions on visualized portions of skin  Neurologic: No gross deficits  Spirometry:  Tracings reviewed. His effort: Good reproducible efforts. FVC: 2.02L FEV1: 0.79L, 32% predicted FEV1/FVC ratio:  51% Interpretation: Spirometry consistent with severe obstructive disease with overlapping restriction-suspect air trapping Please see scanned spirometry results for details.  Assessment/Plan  Asthma is severe but stable.  I offered him another sample of Trelegy, but he prefers not to start this medication until after his cardiac evaluation. Discussed with him that he absolutely needs to finish his cardiac evaluation and seek appropriate treatment for that.  This is very likely contributing to his dyspnea on exertion. 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 I am not going to make any changes to his medications at this point in time. He will continue on his current regimen. Patient Instructions  Asthma COPD overlap syndrome-severe and uncontrolled  Continue montelukast 10 mg once a day to prevent cough or wheeze Continue budesonide 0.5 mg 2 times a day via nebulizer and Perforomist twice a day via nebulizer. Continue albuterol 2 puffs every 4 hours as needed for cough or wheeze OR Instead use albuterol 0.083% solution via nebulizer one unit vial every 4 hours as needed for cough or wheeze Continue Xolair 225 mg injections once every 2 weeks and have access to an epinephrine autoinjector  Continue follow-up with [facility] pulmonary group  Dyspnea on exertion Continue follow-up and cardiac evaluation as scheduled  Allergic rhinitis-chronic and stable Continue Flonase 2 sprays in each nostril once a day as needed for a stuffy nose Continue azelastine 2 sprays in each nostril twice a day as needed for a runny nose Consider saline nasal rinses as needed for nasal symptoms. Use this before any medicated nasal sprays for best result Continue Claritin 10 mg once a day as needed for a runny nose For thick nasal secretions, begin Mucinex 1200 mg twice a day and increase hydration as possible  Allergic  conjunctivitis-chronic and stable Continue a lubricating eye drop Some over the counter eye drops include Pataday one drop in each eye once a day as needed for red, itchy eyes OR Zaditor one drop in each eye twice a day as needed for red itchy eyes.  Reflux-chronic and stable Continue famotidine 20 mg twice a day to control reflux Continue dietary and lifestyle modifications as listed below Continue to follow-up with Dr. [facility], GI specialist, as recommended  Call the clinic if this  treatment plan is not working well for you  Follow up in 3 months or sooner if needed.   Tonny Bollman, MD  [facility]

## 2021-08-20 ENCOUNTER — Other Ambulatory Visit: Payer: Self-pay | Admitting: Internal Medicine

## 2021-08-20 ENCOUNTER — Encounter: Payer: Self-pay | Admitting: Internal Medicine

## 2021-08-20 ENCOUNTER — Other Ambulatory Visit: Payer: Self-pay

## 2021-08-20 ENCOUNTER — Ambulatory Visit (INDEPENDENT_AMBULATORY_CARE_PROVIDER_SITE_OTHER): Payer: Medicare Other | Admitting: Internal Medicine

## 2021-08-20 VITALS — BP 124/82 | HR 61 | Temp 98.2°F | Resp 18 | Ht 63.0 in | Wt <= 1120 oz

## 2021-08-20 DIAGNOSIS — J449 Chronic obstructive pulmonary disease, unspecified: Secondary | ICD-10-CM | POA: Diagnosis not present

## 2021-08-20 DIAGNOSIS — J4550 Severe persistent asthma, uncomplicated: Secondary | ICD-10-CM

## 2021-08-20 DIAGNOSIS — J3089 Other allergic rhinitis: Secondary | ICD-10-CM

## 2021-08-20 DIAGNOSIS — H1013 Acute atopic conjunctivitis, bilateral: Secondary | ICD-10-CM

## 2021-08-20 DIAGNOSIS — K219 Gastro-esophageal reflux disease without esophagitis: Secondary | ICD-10-CM

## 2021-08-20 DIAGNOSIS — H1010 Acute atopic conjunctivitis, unspecified eye: Secondary | ICD-10-CM

## 2021-08-20 NOTE — Patient Instructions (Addendum)
Asthma COPD overlap syndrome-severe and uncontrolled  Continue montelukast 10 mg once a day to prevent cough or wheeze Continue budesonide 0.5 mg 2 times a day via nebulizer and Perforomist twice a day via nebulizer. Continue albuterol 2 puffs every 4 hours as needed for cough or wheeze OR Instead use albuterol 0.083% solution via nebulizer one unit vial every 4 hours as needed for cough or wheeze Continue Xolair 225 mg injections once every 2 weeks and have access to an epinephrine autoinjector  Continue follow-up with [facility] pulmonary group  Dyspnea on exertion Continue follow-up and cardiac evaluation as scheduled  Allergic rhinitis-chronic and stable Continue Flonase 2 sprays in each nostril once a day as needed for a stuffy nose Continue azelastine 2 sprays in each nostril twice a day as needed for a runny nose Consider saline nasal rinses as needed for nasal symptoms. Use this before any medicated nasal sprays for best result Continue Claritin 10 mg once a day as needed for a runny nose For thick nasal secretions, begin Mucinex 1200 mg twice a day and increase hydration as possible  Allergic conjunctivitis-chronic and stable Continue a lubricating eye drop Some over the counter eye drops include Pataday one drop in each eye once a day as needed for red, itchy eyes OR Zaditor one drop in each eye twice a day as needed for red itchy eyes.  Reflux-chronic and stable Continue famotidine 20 mg twice a day to control reflux Continue dietary and lifestyle modifications as listed below Continue to follow-up with Dr. [name], GI specialist, as recommended  Call the clinic if this treatment plan is not working well for you  Follow up in 3 months or sooner if needed.

## 2021-08-31 DIAGNOSIS — J4550 Severe persistent asthma, uncomplicated: Secondary | ICD-10-CM | POA: Diagnosis not present

## 2021-09-01 ENCOUNTER — Ambulatory Visit (INDEPENDENT_AMBULATORY_CARE_PROVIDER_SITE_OTHER): Payer: Medicare Other

## 2021-09-01 ENCOUNTER — Other Ambulatory Visit: Payer: Self-pay

## 2021-09-01 DIAGNOSIS — J4550 Severe persistent asthma, uncomplicated: Secondary | ICD-10-CM

## 2021-09-15 ENCOUNTER — Ambulatory Visit: Payer: Medicare Other

## 2021-09-16 DIAGNOSIS — J4550 Severe persistent asthma, uncomplicated: Secondary | ICD-10-CM | POA: Diagnosis not present

## 2021-09-17 ENCOUNTER — Ambulatory Visit (INDEPENDENT_AMBULATORY_CARE_PROVIDER_SITE_OTHER): Payer: Medicare Other

## 2021-09-17 ENCOUNTER — Other Ambulatory Visit: Payer: Self-pay

## 2021-09-17 DIAGNOSIS — J4550 Severe persistent asthma, uncomplicated: Secondary | ICD-10-CM

## 2021-09-28 DIAGNOSIS — J4550 Severe persistent asthma, uncomplicated: Secondary | ICD-10-CM | POA: Diagnosis not present

## 2021-09-29 ENCOUNTER — Other Ambulatory Visit: Payer: Self-pay

## 2021-09-29 ENCOUNTER — Ambulatory Visit (INDEPENDENT_AMBULATORY_CARE_PROVIDER_SITE_OTHER): Payer: Medicare Other

## 2021-09-29 DIAGNOSIS — J4550 Severe persistent asthma, uncomplicated: Secondary | ICD-10-CM | POA: Diagnosis not present

## 2021-10-12 DIAGNOSIS — J4550 Severe persistent asthma, uncomplicated: Secondary | ICD-10-CM | POA: Diagnosis not present

## 2021-10-13 ENCOUNTER — Ambulatory Visit (INDEPENDENT_AMBULATORY_CARE_PROVIDER_SITE_OTHER): Payer: Medicare Other

## 2021-10-13 DIAGNOSIS — J4550 Severe persistent asthma, uncomplicated: Secondary | ICD-10-CM

## 2021-10-25 ENCOUNTER — Other Ambulatory Visit: Payer: Self-pay | Admitting: Internal Medicine

## 2021-10-26 ENCOUNTER — Other Ambulatory Visit: Payer: Self-pay

## 2021-10-26 MED ORDER — ALBUTEROL SULFATE (2.5 MG/3ML) 0.083% IN NEBU: 2.5000 mg | INHALATION_SOLUTION | RESPIRATORY_TRACT | 1 refills | Status: DC | PRN

## 2021-10-27 DIAGNOSIS — J4550 Severe persistent asthma, uncomplicated: Secondary | ICD-10-CM

## 2021-10-28 ENCOUNTER — Ambulatory Visit (INDEPENDENT_AMBULATORY_CARE_PROVIDER_SITE_OTHER): Payer: Medicare Other

## 2021-10-28 DIAGNOSIS — J4550 Severe persistent asthma, uncomplicated: Secondary | ICD-10-CM

## 2021-11-10 ENCOUNTER — Ambulatory Visit (INDEPENDENT_AMBULATORY_CARE_PROVIDER_SITE_OTHER): Payer: Medicare Other

## 2021-11-10 DIAGNOSIS — J4550 Severe persistent asthma, uncomplicated: Secondary | ICD-10-CM | POA: Diagnosis not present

## 2021-11-22 DIAGNOSIS — J4550 Severe persistent asthma, uncomplicated: Secondary | ICD-10-CM | POA: Diagnosis not present

## 2021-11-23 ENCOUNTER — Ambulatory Visit (INDEPENDENT_AMBULATORY_CARE_PROVIDER_SITE_OTHER): Payer: Medicare Other

## 2021-11-23 DIAGNOSIS — J4550 Severe persistent asthma, uncomplicated: Secondary | ICD-10-CM

## 2021-12-03 DIAGNOSIS — J4550 Severe persistent asthma, uncomplicated: Secondary | ICD-10-CM | POA: Diagnosis not present

## 2021-12-03 NOTE — Progress Notes (Signed)
FOLLOW UP Date of Service/Encounter:  12/06/21   Subjective:  Caleb Mills (DOB: [birth year]) is a 71 y.o. male who [facility] in re-evaluation of the following: asthma, allergic rhinitis, reflux History obtained from: chart review and patient.  For Review, LV was on 08/20/21  with Dr.Ronan Duecker seen for regular follow-up.  Only using nebulizer medications. Continues to have dyspnea on exertion which is worsening-Planning stress test with Cardiology, working on his overall diet and lifestyle.   Unable to tolerate omeprazole so now on famotidine.    Pertinent history/previous diagnostics: Asthma/COPD overlap historically uncontrolled-currently on Xolair, has been on benralizumab in the past.  Trial of Tezspire (felt his asthma got worse after trying this).  Has been referred to Dr. [name] at the [facility] asthma clinic.  However, his original appointment was canceled because she has been out on extended leave.  He was rescheduled with another physician in the pulmonary group and was evaluated by the pulmonary group (Dr. [name]).   He says he hasn't been well since starting Tezspire even though he only received 1 dose.  BP, HR and congestion worsened.  Airways tightened up.  He tried [place] but it didn't seem to do anything for him.  Interestingly, his symptoms worsening coincides several months after having COVID in fall 2020.  He reportedly did well during COVID itself.     He feels Xolair has helped him with his breathing the most. He uses albuterol first, then performist then budesonide twice daily and an extra albuterol in the afternoon.  He feels that nebulizers are more helpful than inhalers.  Reports that he has tried every inhaler available.     -High Res Chest CT 07/30/21: IMPRESSION:  1. No findings to suggest interstitial lung disease.  2. Aortic atherosclerosis, in addition to left main and three-vessel  coronary artery disease. Assessment for  potential risk factor  modification, dietary therapy or pharmacologic therapy may be  warranted, if clinically indicated.  3. There are calcifications of the mitral annulus. Echocardiographic  correlation for evaluation of potential valvular dysfunction may be  warranted if clinically indicated.  4. Status post mechanical aortic valve replacement.  5. Additional incidental findings, as above.    Pulmonary visit 07/14/21: "Plan: Stopped the Perforomist and Pulmicort as these are not controlling his asthma. Switch to Trelegy. -Check CBC with differential -Check IgE -Check Aspergillus -Check ANCA -6-minute walk gen -HRCT . -Refer to pulmonary rehab -  FU 4 weeks . Prior CT from last year showed some basilar scarring. This suggests he may have an element of interstitial lung disease and pulmonary fibrosis.-HRCT chest" -Full PFT  07/14/21:Severe obstructive defect and air trapping is indicated on this PFT. There is significant bronchodilator response. Normal diffusing capacity Visit 08/06/21: asmanex added, continued anoro and albuterol, HRCT without evidence of ILD or ABPA, Aspergillus weakly positive, CBC and ANCA not obtained. FU 3 months - 11/11/21: Cardiology appointment- reported negative echo and stress test at [facility] not available for review; plan to obtain records; enrolled in INR clinic -------------------------------------------------------------- Today presents for follow-up. He reports that he has seen cardiology and h his cardiologist does not feel his current worsening of dyspnea on exertion is due to his heart or heart calcifications. He is supposed to be starting a medication to lower his cholesterol.  However, this has not been called in.  He is unsure who is to be ordering this. He is going to follow-up with Cardiology PA on  the 27th. He did have a stress test and cardiology monitor-was told this looked normal, but his PCP was not sure.  He may be losing his current PCP, and  is unsure who he will follow-up with if that occurs.  He does feel that the medications he is taking are holding him stable.  He has been on this current regimen for many months. Now that he is not working any more, he does have time to do more nebulizer treatments if needed.  He continues to monitor his peak flows-maybe will have 240, 120 peak flow, the highest he has had was 280 for a few days in a row.  Usually stays around 250.  Humidity does seem to affect him most.  Still requires albuterol twice a day (using prior to his controller meds).  He is using throughout the day on occasion.  Speculates around a dozen times in the past month when he is needed to get rescue inhaler during the day.    He does have follow-up scheduled with pulmonary in August.  Does not appear that he has had a ANCA study completed.   Allergies as of 12/06/2021       Reactions   Augmentin [amoxicillin-pot Clavulanate] Nausea Only   Ciprofloxacin Other (See Comments)   Joint pain   Azelastine Other (See Comments)   Dry eyes        Medication List        Accurate as of December 06, 2021  1:40 PM. If you have any questions, ask your nurse or doctor.          albuterol 108 (90 Base) MCG/ACT inhaler Commonly known as: Ventolin HFA USE 2 INHALATIONS BY MOUTH  EVERY 4 HOURS AS NEEDED FOR WHEEZING OR SHORTNESS OF  BREATH   albuterol (2.5 MG/3ML) 0.083% nebulizer solution Commonly known as: PROVENTIL Take 3 mLs (2.5 mg total) by nebulization every 4 (four) hours as needed for wheezing or shortness of breath.   Asmanex HFA 100 MCG/ACT Aero Generic drug: Mometasone Furoate Inhale into the lungs.   Asmanex HFA 100 MCG/ACT Aero Generic drug: Mometasone Furoate Inhale 2 puffs into the lungs 2 (two) times daily.   budesonide 0.5 MG/2ML nebulizer solution Commonly known as: PULMICORT USE 2 ML(0.5 MG) VIA NEBULIZER TWICE DAILY   Cholecalciferol 25 MCG (1000 UT) tablet Take by mouth.   cyclobenzaprine 10 MG  tablet Commonly known as: FLEXERIL Take 10 mg by mouth every 8 (eight) hours as needed.   EPINEPHrine 0.3 mg/0.3 mL Soaj injection Commonly known as: EPI-PEN Inject 0.3 mg into the muscle as needed.   famotidine 20 MG tablet Commonly known as: PEPCID Take one tablet twice daily for reflux   fluticasone 50 MCG/ACT nasal spray Commonly known as: FLONASE Place 1 spray into both nostrils as needed (patient will take one to two sprays each nostril depending upon the weather).   levothyroxine 100 MCG tablet Commonly known as: SYNTHROID Take 100 mcg by mouth daily.   levothyroxine 50 MCG tablet Commonly known as: SYNTHROID Take 100 mcg by mouth daily.   loratadine 10 MG tablet Commonly known as: CLARITIN Take 10 mg by mouth daily.   montelukast 10 MG tablet Commonly known as: SINGULAIR TAKE 1 TABLET BY MOUTH AT  NIGHT TO PREVENT COUGHING  OR WHEEZING What changed: Another medication with the same name was removed. Continue taking this medication, and follow the directions you see here. Changed by: Tonny Bollman, MD   Omega-3 1000 MG Caps Take  by mouth.   Perforomist 20 MCG/2ML nebulizer solution Generic drug: formoterol Use 1 vial via nebulizer twice daily   pravastatin 40 MG tablet Commonly known as: PRAVACHOL Take 1 tablet by mouth daily.   Trelegy Ellipta 100-62.5-25 MCG/ACT Aepb Generic drug: Fluticasone-Umeclidin-Vilant Inhale 1 puff into the lungs daily.   VITAMIN A PO Take by mouth daily.   VITAMIN D PO Take by mouth daily.   warfarin 10 MG tablet Commonly known as: COUMADIN Take 10 mg by mouth. Patient takes warfarin 10mg  3 days per week and on other days 5mg   Dr Clearence PedMike [facility]   ZINC PO Take by mouth.       Past Medical History:  Diagnosis Date   Asthma    S/P total hip resurfacing 2009   left hip   Past Surgical History:  Procedure Laterality Date   AORTIC VALVE REPLACEMENT     APPENDECTOMY  1973   CHOLECYSTECTOMY  1996    HIATAL HERNIA REPAIR     HIP RESURFACING Left 2009   HIP RESURFACING  2014   NASAL SINUS SURGERY     REPLACEMENT TOTAL HIP W/  RESURFACING IMPLANTS Right 2009   ROTATOR CUFF REPAIR Right 2009   THYROIDECTOMY  2003   TOTAL HIP ARTHROPLASTY Right july 2014   Otherwise, there have been no changes to his past medical history, surgical history, family history, or social history.  ROS: All others negative except as noted per HPI.   Objective:  BP 130/64   Pulse 74   Temp 98.5 F (36.9 C) (Temporal)   Resp 17   Wt 148 lb 8 oz (67.4 kg)   SpO2 96%   BMI 26.31 kg/m  Body mass index is 26.31 kg/m. Physical Exam: General Appearance:  Alert, cooperative, no distress, appears stated age  Head:  Normocephalic, without obvious abnormality, atraumatic  Eyes:  Conjunctiva clear, EOM's intact  Nose: Nares normal, normal mucosa, no visible anterior polyps, and septum midline  Throat: Lips, tongue normal; teeth and gums normal, normal posterior oropharynx  Neck: Supple, symmetrical  Lungs:   clear to auscultation bilaterally, Respirations unlabored, no coughing  Heart:  regular rate and rhythm and no murmur, Appears well perfused  Extremities: No edema  Skin: Skin color, texture, turgor normal, no rashes or lesions on visualized portions of skin  Neurologic: No gross deficits    Spirometry:  Tracings reviewed. His effort: Good reproducible efforts. FVC: 1.92L FEV1: 0.82L, 31% predicted FEV1/FVC ratio: 57% Interpretation: Spirometry consistent with mixed obstructive and restrictive disease with severe obstruction. Please see scanned spirometry results for details.  Assessment/Plan  Mr. Philis Nettleeck is a 71 year old male with severe persistent asthma and COPD overlap along with mechanical heart valve and atherosclerosis presenting today for follow-up.  He is overall stable.  We have tried to make multiple medication changes for his regimen in the past which she does not tolerate very well.  He  would like to continue on current therapies.  He will continue to follow-up with both cardiology and pulmonary for additional recommendations for his care.  Asthma COPD overlap syndrome-severe and stable Continue montelukast 10 mg once a day to prevent cough or wheeze Continue budesonide 0.5 mg 2 times a day via nebulizer and Perforomist twice a day via nebulizer. Continue albuterol 2 puffs every 4 hours as needed for cough or wheeze OR Instead use albuterol 0.083% solution via nebulizer one unit vial every 4 hours as needed for cough or wheeze Continue Xolair 225 mg injections once  every 2 weeks and have access to an epinephrine autoinjector  Continue follow-up with [facility] pulmonary group, complete any pending lab work prior to your next visit  Dyspnea on exertion and tachycardia not related to albuterol Continue follow-up and cardiac evaluation as scheduled - call Cardiology sooner to discuss lipid lowering therapy to get this started - discuss tachycardia  Allergic rhinitis-chronic and stable Continue Flonase 2 sprays in each nostril once a day as needed for a stuffy nose Continue azelastine 2 sprays in each nostril twice a day as needed for a runny nose Consider saline nasal rinses as needed for nasal symptoms. Use this before any medicated nasal sprays for best result Continue Claritin 10 mg once a day as needed for a runny nose For thick nasal secretions, begin Mucinex 1200 mg twice a day and increase hydration as possible  Allergic conjunctivitis-chronic and stable Continue a lubricating eye drop Some over the counter eye drops include Pataday one drop in each eye once a day as needed for red, itchy eyes OR Zaditor one drop in each eye twice a day as needed for red itchy eyes.  Reflux-chronic and stable Continue famotidine 20 mg twice a day to control reflux Continue dietary and lifestyle modifications as listed below Continue to follow-up with Dr. [name], GI specialist, as  recommended  Return in 4 months, sooner if needed.  Call the clinic if this treatment plan is not working well for you  It was so great seeing you again today.  Tonny Bollman, MD  [facility]

## 2021-12-04 ENCOUNTER — Other Ambulatory Visit: Payer: Self-pay | Admitting: Family Medicine

## 2021-12-06 ENCOUNTER — Ambulatory Visit (INDEPENDENT_AMBULATORY_CARE_PROVIDER_SITE_OTHER): Payer: Medicare Other | Admitting: Internal Medicine

## 2021-12-06 ENCOUNTER — Ambulatory Visit: Payer: Medicare Other

## 2021-12-06 ENCOUNTER — Encounter: Payer: Self-pay | Admitting: Internal Medicine

## 2021-12-06 VITALS — BP 130/64 | HR 74 | Temp 98.5°F | Resp 17 | Wt 148.5 lb

## 2021-12-06 DIAGNOSIS — H1010 Acute atopic conjunctivitis, unspecified eye: Secondary | ICD-10-CM

## 2021-12-06 DIAGNOSIS — J3089 Other allergic rhinitis: Secondary | ICD-10-CM | POA: Diagnosis not present

## 2021-12-06 DIAGNOSIS — H1013 Acute atopic conjunctivitis, bilateral: Secondary | ICD-10-CM | POA: Diagnosis not present

## 2021-12-06 DIAGNOSIS — J449 Chronic obstructive pulmonary disease, unspecified: Secondary | ICD-10-CM | POA: Diagnosis not present

## 2021-12-06 DIAGNOSIS — K219 Gastro-esophageal reflux disease without esophagitis: Secondary | ICD-10-CM

## 2021-12-06 DIAGNOSIS — J4550 Severe persistent asthma, uncomplicated: Secondary | ICD-10-CM | POA: Diagnosis not present

## 2021-12-06 DIAGNOSIS — R0609 Other forms of dyspnea: Secondary | ICD-10-CM

## 2021-12-06 MED ORDER — MONTELUKAST SODIUM 10 MG PO TABS: ORAL_TABLET | ORAL | 4 refills | Status: DC

## 2021-12-06 NOTE — Patient Instructions (Addendum)
Asthma COPD overlap syndrome-severe and stable Continue montelukast 10 mg once a day to prevent cough or wheeze Continue budesonide 0.5 mg 2 times a day via nebulizer and Perforomist twice a day via nebulizer. Continue albuterol 2 puffs every 4 hours as needed for cough or wheeze OR Instead use albuterol 0.083% solution via nebulizer one unit vial every 4 hours as needed for cough or wheeze Continue Xolair 225 mg injections once every 2 weeks and have access to an epinephrine autoinjector  Continue follow-up with [facility] pulmonary group, complete any pending lab work prior to your next visit  Dyspnea on exertion and tachycardia not related to albuterol Continue follow-up and cardiac evaluation as scheduled - call Cardiology sooner to discuss lipid lowering therapy to get this started - discuss tachycardia  Allergic rhinitis-chronic and stable Continue Flonase 2 sprays in each nostril once a day as needed for a stuffy nose Continue azelastine 2 sprays in each nostril twice a day as needed for a runny nose Consider saline nasal rinses as needed for nasal symptoms. Use this before any medicated nasal sprays for best result Continue Claritin 10 mg once a day as needed for a runny nose For thick nasal secretions, begin Mucinex 1200 mg twice a day and increase hydration as possible  Allergic conjunctivitis-chronic and stable Continue a lubricating eye drop Some over the counter eye drops include Pataday one drop in each eye once a day as needed for red, itchy eyes OR Zaditor one drop in each eye twice a day as needed for red itchy eyes.  Reflux-chronic and stable Continue famotidine 20 mg twice a day to control reflux Continue dietary and lifestyle modifications as listed below Continue to follow-up with Dr. Lyndel Safe, GI specialist, as recommended  Return in 4 months, sooner if needed.  Call the clinic if this treatment plan is not working well for you  It was so great seeing you again  today.

## 2021-12-07 ENCOUNTER — Ambulatory Visit: Payer: Medicare Other

## 2021-12-20 ENCOUNTER — Ambulatory Visit: Payer: Medicare Other

## 2022-01-14 DIAGNOSIS — J4550 Severe persistent asthma, uncomplicated: Secondary | ICD-10-CM | POA: Diagnosis not present

## 2022-01-15 ENCOUNTER — Other Ambulatory Visit: Payer: Self-pay | Admitting: Internal Medicine

## 2022-01-15 DIAGNOSIS — J4550 Severe persistent asthma, uncomplicated: Secondary | ICD-10-CM

## 2022-01-17 ENCOUNTER — Other Ambulatory Visit: Payer: Self-pay

## 2022-01-17 ENCOUNTER — Ambulatory Visit (INDEPENDENT_AMBULATORY_CARE_PROVIDER_SITE_OTHER): Payer: Medicare Other

## 2022-01-17 DIAGNOSIS — J4550 Severe persistent asthma, uncomplicated: Secondary | ICD-10-CM

## 2022-01-17 MED ORDER — BUDESONIDE 0.5 MG/2ML IN SUSP: RESPIRATORY_TRACT | 3 refills | Status: DC

## 2022-01-28 DIAGNOSIS — J4550 Severe persistent asthma, uncomplicated: Secondary | ICD-10-CM | POA: Diagnosis not present

## 2022-01-31 ENCOUNTER — Ambulatory Visit (INDEPENDENT_AMBULATORY_CARE_PROVIDER_SITE_OTHER): Payer: Medicare Other

## 2022-01-31 DIAGNOSIS — J4550 Severe persistent asthma, uncomplicated: Secondary | ICD-10-CM

## 2022-02-11 DIAGNOSIS — J4550 Severe persistent asthma, uncomplicated: Secondary | ICD-10-CM

## 2022-02-14 ENCOUNTER — Ambulatory Visit (INDEPENDENT_AMBULATORY_CARE_PROVIDER_SITE_OTHER): Payer: Medicare Other

## 2022-02-14 DIAGNOSIS — J4550 Severe persistent asthma, uncomplicated: Secondary | ICD-10-CM

## 2022-02-25 DIAGNOSIS — J4550 Severe persistent asthma, uncomplicated: Secondary | ICD-10-CM

## 2022-02-28 ENCOUNTER — Ambulatory Visit (INDEPENDENT_AMBULATORY_CARE_PROVIDER_SITE_OTHER): Payer: Medicare Other

## 2022-02-28 ENCOUNTER — Ambulatory Visit: Payer: Medicare Other

## 2022-02-28 DIAGNOSIS — J4550 Severe persistent asthma, uncomplicated: Secondary | ICD-10-CM

## 2022-03-14 ENCOUNTER — Ambulatory Visit: Payer: Medicare Other

## 2022-03-16 ENCOUNTER — Ambulatory Visit (INDEPENDENT_AMBULATORY_CARE_PROVIDER_SITE_OTHER): Payer: Medicare Other

## 2022-03-16 DIAGNOSIS — J4550 Severe persistent asthma, uncomplicated: Secondary | ICD-10-CM

## 2022-03-17 ENCOUNTER — Other Ambulatory Visit: Payer: Self-pay

## 2022-03-17 NOTE — Telephone Encounter (Signed)
Faxed refill of budesonide 0.5 mg/4ml with 4 refills to [facility] mail order [phone] signed by Dr. Maurine Minister.

## 2022-03-28 ENCOUNTER — Ambulatory Visit (INDEPENDENT_AMBULATORY_CARE_PROVIDER_SITE_OTHER): Payer: Medicare Other | Admitting: Internal Medicine

## 2022-03-28 ENCOUNTER — Other Ambulatory Visit: Payer: Self-pay

## 2022-03-28 ENCOUNTER — Encounter: Payer: Self-pay | Admitting: Internal Medicine

## 2022-03-28 VITALS — BP 112/64 | HR 78 | Temp 98.1°F | Resp 18 | Wt 148.0 lb

## 2022-03-28 DIAGNOSIS — Z952 Presence of prosthetic heart valve: Secondary | ICD-10-CM

## 2022-03-28 DIAGNOSIS — J3089 Other allergic rhinitis: Secondary | ICD-10-CM | POA: Diagnosis not present

## 2022-03-28 DIAGNOSIS — K219 Gastro-esophageal reflux disease without esophagitis: Secondary | ICD-10-CM

## 2022-03-28 DIAGNOSIS — J4551 Severe persistent asthma with (acute) exacerbation: Secondary | ICD-10-CM

## 2022-03-28 DIAGNOSIS — H1013 Acute atopic conjunctivitis, bilateral: Secondary | ICD-10-CM | POA: Diagnosis not present

## 2022-03-28 DIAGNOSIS — E038 Other specified hypothyroidism: Secondary | ICD-10-CM

## 2022-03-28 DIAGNOSIS — H1045 Other chronic allergic conjunctivitis: Secondary | ICD-10-CM

## 2022-03-28 MED ORDER — PREDNISONE 10 MG PO TABS: ORAL_TABLET | ORAL | 1 refills | Status: DC

## 2022-03-28 MED ORDER — CEFDINIR 300 MG PO CAPS: 300.0000 mg | ORAL_CAPSULE | Freq: Two times a day (BID) | ORAL | 0 refills | Status: AC

## 2022-03-28 MED ORDER — FAMOTIDINE 20 MG PO TABS: ORAL_TABLET | ORAL | 3 refills | Status: DC

## 2022-03-28 NOTE — Progress Notes (Signed)
Follow Up Note  RE: Caleb Mills MRN: [id number] DOB: 20-Dec-1950 Date of Office Visit: 03/28/2022  Referring provider: Charlette Caffey, MD Primary care provider: Charlette Caffey, MD  Chief Complaint: Follow-up and Cough (Follow up cold x 8 days increased asthma symptoms, mucinex helps his am peak flow 150-250. Healthy he is 89.)  History of Present Illness: I had the pleasure of seeing Arif Amendola for a [facility]  on 03/28/2022. He is a 71 y.o. male, who is being followed for severe persistent asthma on Xolair, allergic rhinitis, GERD. His previous allergy office visit was on 12/06/21 with Dr. Maurine Minister. Today is a  acute visit .  History obtained from patient, chart review.  He has a complicated pulmonary history with intolerance to multiple medications.  He follows with [facility] pulmonology as well as [facility].  ASTHMA - Medical therapy: Yulperi, performist, singulair, xolair , montelukast, budesonide,  - Rescue inhaler use: he uses scheduled albuterol  - Symptoms: over the past 8 days he has had increased dyspnea, sputum, cough and wheeze which started as a cold from his grand kids.   - Exacerbation history: 0 ABX for respiratory illness since last visit, - ACT: 15 /25 - Adverse effects of medication: denies  - Previous FEV1: 0.91 L, 31% - [facility] on xolair 225 mg injections every 2 weeks  -Since reducing levothyroxine dose due to out of range TSH, heart palpitations have resolved.   -Since pulmonary started Yulperi he has had significant improvement in peak flows up to 300s.  Over the past few days he has noticed a decrease to 150.  He has increased his Mucinex which is helped somewhat but he is concerned for exacerbation  -Also reports follow up with cardiology to.  His arthrosclerosis.  He has mechanical valve procedural options are not indicated at this time.  Pertinent history/previous  diagnostics: Asthma/COPD overlap historically uncontrolled-currently on Xolair, has been on benralizumab in the past.  Trial of Tezspire (felt his asthma got worse after trying this).  Has been referred to Dr. [name] at the severe asthma clinic.  However, his original appointment was canceled because she has been out on extended leave.  He was rescheduled with another physician in the pulmonary group and was evaluated by the pulmonary group (Dr. [name]).   He says he hasn't been well since starting Tezspire even though he only received 1 dose.  BP, HR and congestion worsened.  Airways tightened up.  He tried [place] but it didn't seem to do anything for him.  Interestingly, his symptoms worsening coincides several months after having COVID in fall 2020.  He reportedly did well during COVID itself.     He feels Xolair has helped him with his breathing the most. .  He feels that nebulizers are more helpful than inhalers.  Reports that he has tried every inhaler available.     -High Res Chest CT 07/30/21: IMPRESSION:  1. No findings to suggest interstitial lung disease.  2. Aortic atherosclerosis, in addition to left main and three-vessel  coronary artery disease. Assessment for potential risk factor  modification, dietary therapy or pharmacologic therapy may be  warranted, if clinically indicated.  3. There are calcifications of the mitral annulus. Echocardiographic  correlation for evaluation of potential valvular dysfunction may be  warranted if clinically indicated.  4. Status post mechanical aortic valve replacement.  5. Additional incidental findings, as above.  Pulmonary visit 07/14/21: "Plan: Stopped the Perforomist and Pulmicort as these are not controlling his asthma. Switch to Trelegy. -Check CBC with differential -Check IgE -Check Aspergillus -Check ANCA -6-minute walk gen -HRCT . -Refer to pulmonary rehab -  FU 4 weeks . Prior CT from last year showed some basilar scarring. This  suggests he may have an element of interstitial lung disease and pulmonary fibrosis.-HRCT chest" -Full PFT  07/14/21:Severe obstructive defect and air trapping is indicated on this PFT. There is significant bronchodilator response. Normal diffusing capacity Visit 08/06/21: asmanex added, continued anoro and albuterol, HRCT without evidence of ILD or ABPA, Aspergillus weakly positive, CBC and ANCA not obtained. FU 3 months - 11/11/21: Cardiology appointment- reported negative echo and stress test at [facility] not available for review; plan to obtain records; enrolled in INR clinic - Pulmonary visit 02/16/22: Yulperi started, CBC, ANCA, IgE ordered but not done.    Assessment and Plan: Tresean is a 71 y.o. male with: Severe persistent asthma with acute exacerbation - Plan: Spirometry with Graph  Other allergic rhinitis  Gastroesophageal reflux disease without esophagitis  Mechanical heart valve present  Other specified hypothyroidism  Other chronic allergic conjunctivitis of both eyes Plan: Patient Instructions  Asthma COPD overlap syndrome-with acute flare  Take prednisone 40mg  daily x 3 days, 30mg  daily x 3 days, 20mg  daily x 3 days and 10mg  daily x 3 days. Start Cefdinir 300mg  twice daily for 5 days   Continue montelukast 10 mg once a day to prevent cough or wheeze Continue Yulperi  Continue budesonide 0.5 mg 2 times a day via nebulizer and Perforomist twice a day via nebulizer. Continue albuterol 2 puffs every 4 hours as needed for cough or wheeze OR Instead use albuterol 0.083% solution via nebulizer one unit vial every 4 hours as needed for cough or wheeze Continue Xolair 225 mg injections once every 2 weeks and have access to an epinephrine autoinjector  Follow up with pulmonary as planned    Allergic rhinitis-chronic and stable Continue Flonase 2 sprays in each nostril once a day as needed for a stuffy nose Continue azelastine 2 sprays in each nostril twice a day as needed  for a runny nose Consider saline nasal rinses as needed for nasal symptoms. Use this before any medicated nasal sprays for best result Continue Claritin 10 mg once a day as needed for a runny nose Continue Mucinex 1200 mg twice a day and increase hydration as possible  Allergic conjunctivitis-chronic and stable Continue a lubricating eye drop Some over the counter eye drops include Pataday one drop in each eye once a day as needed for red, itchy eyes OR Zaditor one drop in each eye twice a day as needed for red itchy eyes.  Reflux-chronic and stable Continue famotidine 20 mg twice a day to control reflux Continue dietary and lifestyle modifications as listed below Continue to follow-up with Dr. , GI specialist, as recommended  Return in 4 months, sooner if needed.  Call the clinic if this treatment plan is not working well for you  It was so great seeing you again today. No follow-ups on file.  Meds ordered this encounter  Medications   famotidine (PEPCID) 20 MG tablet    Sig: Take one tablet twice daily for reflux    Dispense:  180 tablet    Refill:  3    Dispense 90 day supply   DISCONTD: predniSONE (DELTASONE) 10 MG tablet    Sig: Take 4 tabs by mouth daily  for 3 days, then take 3 tabs by mouth daily for 3 days then take 2 tabs by mouth daily for 3 days then take 1 tab by mouth daily for 3 days, then stop    Dispense:  30 tablet    Refill:  1   cefdinir (OMNICEF) 300 MG capsule    Sig: Take 1 capsule (300 mg total) by mouth 2 (two) times daily for 5 days.    Dispense:  10 capsule    Refill:  0    Lab Orders  No laboratory test(s) ordered today   Diagnostics: Spirometry:  Tracings reviewed. His effort: Good reproducible efforts. FVC: 1.96 L FEV1: 0.91 L, 30% predicted FEV1/FVC ratio: 46% Interpretation: Spirometry consistent with severe obstructive disease.  Please see scanned spirometry results for details.    Medication List:  Current Outpatient  Medications  Medication Sig Dispense Refill   albuterol (PROVENTIL) (2.5 MG/3ML) 0.083% nebulizer solution Take 3 mLs (2.5 mg total) by nebulization every 4 (four) hours as needed for wheezing or shortness of breath. 150 mL 1   albuterol (VENTOLIN HFA) 108 (90 Base) MCG/ACT inhaler USE 2 INHALATIONS BY MOUTH  EVERY 4 HOURS AS NEEDED FOR WHEEZING OR SHORTNESS OF  BREATH 1 each 1   budesonide (PULMICORT) 0.5 MG/2ML nebulizer solution Use vial per treatment. 120 mL 3   cefdinir (OMNICEF) 300 MG capsule Take 1 capsule (300 mg total) by mouth 2 (two) times daily for 5 days. 10 capsule 0   Cholecalciferol 25 MCG (1000 UT) tablet Take by mouth.     EPINEPHrine 0.3 mg/0.3 mL IJ SOAJ injection Inject 0.3 mg into the muscle as needed. 1 each 2   fluticasone (FLONASE) 50 MCG/ACT nasal spray Place 1 spray into both nostrils as needed (patient will take one to two sprays each nostril depending upon the weather).      levothyroxine (SYNTHROID) 75 MCG tablet Take 75 mcg by mouth daily.     loratadine (CLARITIN) 10 MG tablet Take 10 mg by mouth daily.     montelukast (SINGULAIR) 10 MG tablet TAKE 1 TABLET BY MOUTH AT  NIGHT TO PREVENT COUGHING  OR WHEEZING 90 tablet 4   Multiple Vitamins-Minerals (ZINC PO) Take by mouth.     Omega-3 1000 MG CAPS Take by mouth.     PERFOROMIST 20 MCG/2ML nebulizer solution Use 1 vial via nebulizer twice daily 360 mL 3   pravastatin (PRAVACHOL) 40 MG tablet Take 1 tablet by mouth daily.     VITAMIN A PO Take by mouth daily.     VITAMIN D PO Take by mouth daily.     warfarin (COUMADIN) 10 MG tablet Take 10 mg by mouth. Patient takes warfarin 10mg  3 days per week and on other days 5mg   Dr [facility]  1   warfarin (COUMADIN) 5 MG tablet Take by mouth.     [name] 175 MCG/3ML nebulizer solution SMARTSIG:3 Milliliter(s) Via Nebulizer Daily     famotidine (PEPCID) 20 MG tablet Take one tablet twice daily for reflux 180 tablet 3   predniSONE (DELTASONE) 10 MG tablet  Take 4 tabs by mouth daily for 3 days, then take 3 tabs by mouth daily for 3 days then take 2 tabs by mouth daily for 3 days then take 1 tab by mouth daily for 3 days, then stop 30 tablet 1   Current Facility-Administered Medications  Medication Dose Route Frequency Provider Last Rate Last Admin   omalizumab ) injection 225 mg  225  mg Subcutaneous Q14 Days Dara Hoyer, FNP   225 mg at 03/16/22 5053   Allergies: Allergies  Allergen Reactions   Augmentin [Amoxicillin-Pot Clavulanate] Nausea Only   Ciprofloxacin Other (See Comments)    Joint pain   Azelastine Other (See Comments)    Dry eyes   I reviewed his past medical history, social history, family history, and environmental history and no significant changes have been reported from his previous visit.  ROS: All others negative except as noted per HPI.   Objective: BP 112/64 (BP Location: Left Arm, Patient Position: Sitting, Cuff Size: Normal)   Pulse 78   Temp 98.1 F (36.7 C) (Temporal)   Resp 18   Wt 148 lb (67.1 kg)   SpO2 97%   BMI 26.22 kg/m  Body mass index is 26.22 kg/m. General Appearance:  Alert, cooperative, no distress, appears stated age  Head:  Normocephalic, without obvious abnormality, atraumatic  Eyes:  Conjunctiva clear, EOM's intact  Nose: Nares normal, hypertrophic turbinates, no visible anterior polyps, and septum midline  Throat: Lips, tongue normal; teeth and gums normal, + cobblestoning  Neck: Supple, symmetrical  Lungs:   end-expiratory wheezing, Respirations unlabored, intermittent dry coughing  Heart:  End systolic click and regular rate and rhythm, Appears well perfused  Extremities: No edema  Skin: Skin color, texture, turgor normal, no rashes or lesions on visualized portions of skin   Neurologic: No gross deficits   Previous notes and tests were reviewed. The plan was reviewed with the patient/family, and all questions/concerned were addressed.  It was my pleasure to see Joas  today and participate in his care. Please feel free to contact me with any questions or concerns.  Sincerely,  Roney Marion, MD  Allergy & Immunology  [facility] [place] [facility] Office: [phone]

## 2022-03-28 NOTE — Patient Instructions (Addendum)
Asthma COPD overlap syndrome-with acute flare  Take prednisone 40mg  daily x 3 days, 30mg  daily x 3 days, 20mg  daily x 3 days and 10mg  daily x 3 days. Start Cefdinir 300mg  twice daily for 5 days   Continue montelukast 10 mg once a day to prevent cough or wheeze Continue Yulperi  Continue budesonide 0.5 mg 2 times a day via nebulizer and Perforomist twice a day via nebulizer. Continue albuterol 2 puffs every 4 hours as needed for cough or wheeze OR Instead use albuterol 0.083% solution via nebulizer one unit vial every 4 hours as needed for cough or wheeze Continue Xolair 225 mg injections once every 2 weeks and have access to an epinephrine autoinjector  Follow up with pulmonary as planned    Allergic rhinitis-chronic and stable Continue Flonase 2 sprays in each nostril once a day as needed for a stuffy nose Continue azelastine 2 sprays in each nostril twice a day as needed for a runny nose Consider saline nasal rinses as needed for nasal symptoms. Use this before any medicated nasal sprays for best result Continue Claritin 10 mg once a day as needed for a runny nose Continue Mucinex 1200 mg twice a day and increase hydration as possible  Allergic conjunctivitis-chronic and stable Continue a lubricating eye drop Some over the counter eye drops include Pataday one drop in each eye once a day as needed for red, itchy eyes OR Zaditor one drop in each eye twice a day as needed for red itchy eyes.  Reflux-chronic and stable Continue famotidine 20 mg twice a day to control reflux Continue dietary and lifestyle modifications as listed below Continue to follow-up with Dr. Lyndel Safe, GI specialist, as recommended  Return in 4 months, sooner if needed.  Call the clinic if this treatment plan is not working well for you  It was so great seeing you again today.

## 2022-03-30 ENCOUNTER — Ambulatory Visit (INDEPENDENT_AMBULATORY_CARE_PROVIDER_SITE_OTHER): Payer: Medicare Other

## 2022-03-30 DIAGNOSIS — J4551 Severe persistent asthma with (acute) exacerbation: Secondary | ICD-10-CM | POA: Diagnosis not present

## 2022-04-13 ENCOUNTER — Ambulatory Visit (INDEPENDENT_AMBULATORY_CARE_PROVIDER_SITE_OTHER): Payer: Medicare Other

## 2022-04-13 ENCOUNTER — Other Ambulatory Visit: Payer: Self-pay

## 2022-04-13 DIAGNOSIS — J4550 Severe persistent asthma, uncomplicated: Secondary | ICD-10-CM | POA: Diagnosis not present

## 2022-04-13 MED ORDER — BUDESONIDE 0.5 MG/2ML IN SUSP: RESPIRATORY_TRACT | 2 refills | Status: DC

## 2022-04-22 ENCOUNTER — Other Ambulatory Visit: Payer: Self-pay | Admitting: Internal Medicine

## 2022-04-27 ENCOUNTER — Ambulatory Visit: Payer: Medicare Other

## 2022-04-27 ENCOUNTER — Ambulatory Visit (INDEPENDENT_AMBULATORY_CARE_PROVIDER_SITE_OTHER): Payer: Medicare Other | Admitting: Internal Medicine

## 2022-04-27 VITALS — BP 122/60 | HR 90 | Temp 97.6°F | Resp 19 | Wt 153.4 lb

## 2022-04-27 DIAGNOSIS — H1013 Acute atopic conjunctivitis, bilateral: Secondary | ICD-10-CM | POA: Diagnosis not present

## 2022-04-27 DIAGNOSIS — H1010 Acute atopic conjunctivitis, unspecified eye: Secondary | ICD-10-CM

## 2022-04-27 DIAGNOSIS — J4550 Severe persistent asthma, uncomplicated: Secondary | ICD-10-CM

## 2022-04-27 DIAGNOSIS — K219 Gastro-esophageal reflux disease without esophagitis: Secondary | ICD-10-CM | POA: Diagnosis not present

## 2022-04-27 DIAGNOSIS — J4489 Other specified chronic obstructive pulmonary disease: Secondary | ICD-10-CM

## 2022-04-27 DIAGNOSIS — J3089 Other allergic rhinitis: Secondary | ICD-10-CM

## 2022-04-27 MED ORDER — DOXYCYCLINE MONOHYDRATE 100 MG PO TABS: 100.0000 mg | ORAL_TABLET | Freq: Two times a day (BID) | ORAL | 0 refills | Status: AC

## 2022-04-27 MED ORDER — METHYLPREDNISOLONE ACETATE 40 MG/ML IJ SUSP
40.0000 mg | Freq: Once | INTRAMUSCULAR | Status: AC
  Administered 2022-04-27: 40 mg via INTRAMUSCULAR

## 2022-04-27 NOTE — Patient Instructions (Addendum)
Asthma COPD overlap syndrome-with acute flare  Breathing tests today showed:   persistent inflammation in your lungs  Medrol 40mg  IM given today in clinic  Start doxycycine 100mg  twice a day for 7 days   Continue montelukast 10 mg once a day to prevent cough or wheeze Continue Yulperi  Continue budesonide 0.5 mg 2 times a day via nebulizer and Perforomist twice a day via nebulizer. Continue albuterol 2 puffs every 4 hours as needed for cough or wheeze OR Instead use albuterol 0.083% solution via nebulizer one unit vial every 4 hours as needed for cough or wheeze Continue Xolair 225 mg injections once every 2 weeks and have access to an epinephrine autoinjector  Follow up with pulmonary as planned    Allergic rhinitis-chronic and stable Continue Flonase 2 sprays in each nostril once a day as needed for a stuffy nose Continue azelastine 2 sprays in each nostril twice a day as needed for a runny nose Consider saline nasal rinses as needed for nasal symptoms. Use this before any medicated nasal sprays for best result Continue Claritin 10 mg once a day as needed for a runny nose Continue Mucinex 1200 mg twice a day and increase hydration as possible  Allergic conjunctivitis-chronic and stable Continue a lubricating eye drop Some over the counter eye drops include Pataday one drop in each eye once a day as needed for red, itchy eyes OR Zaditor one drop in each eye twice a day as needed for red itchy eyes. Continue to follow with ophthalmology for cataracts   Reflux-chronic and stable Continue famotidine 20 mg twice a day to control reflux Continue dietary and lifestyle modifications as listed below Continue to follow-up with Dr. Lyndel Safe, GI specialist, as recommended  Follow up: 3 months  If you have another exacerbation between now and February we will need to get ou in to Pulmonary sooner!   Thank you so much for letting me partake in your care today.  Don't hesitate to reach out if you  have any additional concerns!  Roney Marion, MD  [facility] [place] [place]

## 2022-04-27 NOTE — Progress Notes (Signed)
Follow Up Note  RE: Caleb Mills MRN: [id number] DOB: [birth year] Date of Office Visit: 04/27/2022  Referring provider: Charlette Caffey, MD Primary care provider: Charlette Caffey, MD  Chief Complaint: No chief complaint on file.  History of Present Illness: I had the pleasure of seeing Caleb Mills for a follow up visit at the [facility] on 05/02/2022. He is a 71 y.o. male, who is being followed for severe persistent asthma on Xolair, allergic rhinitis, GERD. Caleb Mills His previous allergy office visit was on 03/28/22 with Dr. [name]. Today is a regular follow up visit.  At last visit he was treated for asthma COPD exacerbation with cefdinir and prednisone.  History obtained from patient, chart review.He has a complicated pulmonary history with intolerance to multiple medications.  He follows with [facility] pulmonology as well as allergy and asthma Center.  He has not seen cardiology or pulmonary since last visit.   He was diagnosed with cataracts in right eye and has since had cataract surgery and implantation of intraocular lens.     ASTHMA -He reports initial improvement in his symptoms after antibiotics and steroids at last visit.  However over the past 7 days he has had increasing productive sputum, dyspnea, wheezing.  He attributes this to getting high-dose flu shot as initially had flulike symptoms afterwards.  However productive sputum started after flulike symptoms stopped.  He has follow-up with pulmonary in February. - Exacerbation history: 0 ABX for respiratory illness since last visit, 1 OCS, 0ED, 0 UC visits in the past year  - Previous FEV1: 0.91 L, 30% - Biologic Labs on xolair 225 mg injections every 2 weeks   Rhinitis:  -Medical therapy includes Flonase 2 sprays in nostril as needed, Astelin 2 sprays in each nostril twice a day as needed, Claritin 10 mg daily as needed, Mucinex 1200 mg twice a day as needed, saline rinses -   Gerd:  -Well  controlled  -On famotidine 20 mg twice daily, follows with Dr. Chales Mills GI specialist.   Pertinent history/previous diagnostics: Asthma/COPD overlap historically uncontrolled-currently on Xolair, has been on benralizumab in the past.  Trial of Tezspire (felt his asthma got worse after trying this).  Has been referred to Dr. Vira Mills at the severe asthma clinic.  However, his original appointment was canceled because she has been out on extended leave.  He was rescheduled with another physician in the pulmonary group and was evaluated by the pulmonary group (Dr. Su Mills).   He says he hasn't been well since starting Tezspire even though he only received 1 dose.  BP, HR and congestion worsened.  Airways tightened up.  He tried [place] but it didn't seem to do anything for him.  Interestingly, his symptoms worsening coincides several months after having COVID in fall 2020.  He reportedly did well during COVID itself.     He feels Xolair has helped him with his breathing the most. .  He feels that nebulizers are more helpful than inhalers.  Reports that he has tried every inhaler available.     -High Res Chest CT 07/30/21: IMPRESSION:  1. No findings to suggest interstitial lung disease.  2. Aortic atherosclerosis, in addition to left main and three-vessel  coronary artery disease. Assessment for potential risk factor  modification, dietary therapy or pharmacologic therapy may be  warranted, if clinically indicated.  3. There are calcifications of the mitral annulus. Echocardiographic  correlation for evaluation of potential valvular dysfunction may be  warranted if clinically indicated.  4. Status post mechanical aortic valve replacement.  5. Additional incidental findings, as above.    Pulmonary visit 07/14/21: "Plan: Stopped the Perforomist and Pulmicort as these are not controlling his asthma. Switch to Trelegy. -Check CBC with differential -Check IgE -Check Aspergillus -Check ANCA -6-minute walk  gen -HRCT . -Refer to pulmonary rehab -  FU 4 weeks . Prior CT from last year showed some basilar scarring. This suggests he may have an element of interstitial lung disease and pulmonary fibrosis.-HRCT chest" -Full PFT  07/14/21:Severe obstructive defect and air trapping is indicated on this PFT. There is significant bronchodilator response. Normal diffusing capacity Visit 08/06/21: asmanex added, continued anoro and albuterol, HRCT without evidence of ILD or ABPA, Aspergillus weakly positive, CBC and ANCA not obtained. FU 3 months - 11/11/21: Cardiology appointment- reported negative echo and stress test at [facility] not available for review; plan to obtain records; enrolled in INR clinic - Pulmonary visit 02/16/22: Yulperi started, CBC, ANCA, IgE ordered but not done.   Assessment and Plan: Caleb Mills is a 71 y.o. male with: Asthma-COPD overlap syndrome - Plan: methylPREDNISolone acetate (DEPO-MEDROL) injection 40 mg, Spirometry with Graph  Seasonal allergic conjunctivitis  Severe persistent asthma without complication  Gastroesophageal reflux disease without esophagitis  Other allergic rhinitis  Severe persistent asthma, unspecified whether complicated Plan: Patient Instructions  Asthma COPD overlap syndrome-with acute flare  Breathing tests today showed:   persistent inflammation in your lungs  Medrol 40mg  IM given today in clinic  Start doxycycine 100mg  twice a day for 7 days   Continue montelukast 10 mg once a day to prevent cough or wheeze Continue Yulperi  Continue budesonide 0.5 mg 2 times a day via nebulizer and Perforomist twice a day via nebulizer. Continue albuterol 2 puffs every 4 hours as needed for cough or wheeze OR Instead use albuterol 0.083% solution via nebulizer one unit vial every 4 hours as needed for cough or wheeze Continue Xolair 225 mg injections once every 2 weeks and have access to an epinephrine autoinjector  Follow up with pulmonary as planned     Allergic rhinitis-chronic and stable Continue Flonase 2 sprays in each nostril once a day as needed for a stuffy nose Continue azelastine 2 sprays in each nostril twice a day as needed for a runny nose Consider saline nasal rinses as needed for nasal symptoms. Use this before any medicated nasal sprays for best result Continue Claritin 10 mg once a day as needed for a runny nose Continue Mucinex 1200 mg twice a day and increase hydration as possible  Allergic conjunctivitis-chronic and stable Continue a lubricating eye drop Some over the counter eye drops include Pataday one drop in each eye once a day as needed for red, itchy eyes OR Zaditor one drop in each eye twice a day as needed for red itchy eyes. Continue to follow with ophthalmology for cataracts   Reflux-chronic and stable Continue famotidine 20 mg twice a day to control reflux Continue dietary and lifestyle modifications as listed below Continue to follow-up with Dr. , GI specialist, as recommended  Follow up: 3 months  If you have another exacerbation between now and February we will need to get ou in to Pulmonary sooner!   Thank you so much for letting me partake in your care today.  Don't hesitate to reach out if you have any additional concerns!  Caleb Abrahams, MD  [facility] [place] [place]  No follow-ups on file.  Meds ordered this encounter  Medications   doxycycline (ADOXA) 100 MG tablet    Sig: Take 1 tablet (100 mg total) by mouth 2 (two) times daily for 7 days.    Dispense:  14 tablet    Refill:  0   methylPREDNISolone acetate (DEPO-MEDROL) injection 40 mg    Lab Orders  No laboratory test(s) ordered today   Diagnostics: Spirometry:  Tracings reviewed. His effort: Good reproducible efforts. FVC: 2.41 L FEV1: 0.87 L, 35% predicted FEV1/FVC ratio: 36% Interpretation: Spirometry consistent with mixed obstructive and restrictive disease.  Please see scanned spirometry  results for details.  Results interpreted by myself during this encounter and discussed with patient/family.   Medication List:  Current Outpatient Medications  Medication Sig Dispense Refill   doxycycline (ADOXA) 100 MG tablet Take 1 tablet (100 mg total) by mouth 2 (two) times daily for 7 days. 14 tablet 0   albuterol (PROVENTIL) (2.5 MG/3ML) 0.083% nebulizer solution USE 1 VIAL VIA NEBULIZER EVERY 4 HOURS AS NEEDED FOR WHEEZING OR SHORTNESS OF BREATH, 150 mL 1   albuterol (VENTOLIN HFA) 108 (90 Base) MCG/ACT inhaler USE 2 INHALATIONS BY MOUTH  EVERY 4 HOURS AS NEEDED FOR WHEEZING OR SHORTNESS OF  BREATH 1 each 1   budesonide (PULMICORT) 0.5 MG/2ML nebulizer solution Use vial per treatment. 120 mL 2   Cholecalciferol 25 MCG (1000 UT) tablet Take by mouth.     EPINEPHrine 0.3 mg/0.3 mL IJ SOAJ injection Inject 0.3 mg into the muscle as needed. 1 each 2   famotidine (PEPCID) 20 MG tablet Take one tablet twice daily for reflux 180 tablet 3   fluticasone (FLONASE) 50 MCG/ACT nasal spray Place 1 spray into both nostrils as needed (patient will take one to two sprays each nostril depending upon the weather).      levothyroxine (SYNTHROID) 75 MCG tablet Take 75 mcg by mouth daily.     loratadine (CLARITIN) 10 MG tablet Take 10 mg by mouth daily.     montelukast (SINGULAIR) 10 MG tablet TAKE 1 TABLET BY MOUTH AT  NIGHT TO PREVENT COUGHING  OR WHEEZING 90 tablet 4   Multiple Vitamins-Minerals (ZINC PO) Take by mouth.     Omega-3 1000 MG CAPS Take by mouth.     PERFOROMIST 20 MCG/2ML nebulizer solution Use 1 vial via nebulizer twice daily 360 mL 3   pravastatin (PRAVACHOL) 40 MG tablet Take 1 tablet by mouth daily.     predniSONE (DELTASONE) 10 MG tablet Take 4 tabs by mouth daily for 3 days, then take 3 tabs by mouth daily for 3 days then take 2 tabs by mouth daily for 3 days then take 1 tab by mouth daily for 3 days, then stop 30 tablet 1   VITAMIN A PO Take by mouth daily.     VITAMIN D PO Take  by mouth daily.     warfarin (COUMADIN) 10 MG tablet Take 10 mg by mouth. Patient takes warfarin 10mg  3 days per week and on other days 5mg   Dr Medical  1   warfarin (COUMADIN) 5 MG tablet Take by mouth.     [name] 175 MCG/3ML nebulizer solution SMARTSIG:3 Milliliter(s) Via Nebulizer Daily     Current Facility-Administered Medications  Medication Dose Route Frequency Provider Last Rate Last Admin   omalizumab ) injection 225 mg  225 mg Subcutaneous Q14 Days Clearence Ped, FNP   225 mg at 04/27/22 1511   Allergies: Allergies  Allergen Reactions   Augmentin [  Amoxicillin-Pot Clavulanate] Nausea Only   Ciprofloxacin Other (See Comments)    Joint pain   Azelastine Other (See Comments)    Dry eyes   I reviewed his past medical history, social history, family history, and environmental history and no significant changes have been reported from his previous visit.  ROS: All others negative except as noted per HPI.   Objective: BP 122/60   Pulse 90   Temp 97.6 F (36.4 C) (Temporal)   Resp 19   Wt 153 lb 6.4 oz (69.6 kg)   SpO2 98%   BMI 27.17 kg/m  Body mass index is 27.17 kg/m. General Appearance:  Alert, cooperative, no distress, appears stated age  Head:  Normocephalic, without obvious abnormality, atraumatic  Eyes:  Conjunctiva clear, EOM's intact  Nose: Nares normal,  clear to yellow rhinorrhea, hypertrophic turbinates, no visible anterior polyps, and septum midline  Throat: Lips, tongue normal; teeth and gums normal, no tonsillar exudate  Neck: Supple, symmetrical  Lungs:   end-expiratory wheezing, Respirations unlabored, intermittent dry coughing  Heart:  regular rate and rhythm and no murmur, Appears well perfused  Extremities: No edema  Skin: Skin color, texture, turgor normal, no rashes or lesions on visualized portions of skin   Neurologic: No gross deficits   Previous notes and tests were reviewed. The plan was reviewed with the  patient/family, and all questions/concerned were addressed.  It was my pleasure to see Deni today and participate in his care. Please feel free to contact me with any questions or concerns.  Sincerely,  Roney Marion, MD  Allergy & Immunology  [facility] Office: [phone]

## 2022-05-02 ENCOUNTER — Encounter: Payer: Self-pay | Admitting: Internal Medicine

## 2022-05-06 ENCOUNTER — Telehealth: Payer: Self-pay | Admitting: Internal Medicine

## 2022-05-06 ENCOUNTER — Ambulatory Visit (HOSPITAL_BASED_OUTPATIENT_CLINIC_OR_DEPARTMENT_OTHER)
Admission: RE | Admit: 2022-05-06 | Discharge: 2022-05-06 | Disposition: A | Discharge status: Home / Self Care | Payer: Medicare Other | Source: Ambulatory Visit | Attending: Internal Medicine | Admitting: Internal Medicine

## 2022-05-06 DIAGNOSIS — R059 Cough, unspecified: Secondary | ICD-10-CM | POA: Diagnosis present

## 2022-05-06 DIAGNOSIS — J4550 Severe persistent asthma, uncomplicated: Secondary | ICD-10-CM | POA: Insufficient documentation

## 2022-05-06 MED ORDER — PREDNISONE 20 MG PO TABS: 40.0000 mg | ORAL_TABLET | Freq: Every day | ORAL | 0 refills | Status: AC

## 2022-05-06 NOTE — Telephone Encounter (Signed)
Chest x ray ordered per Dr. Simona Huh. He is having chest tightness and constant cough and stomach muscles are sore from coughing. Occasionally coughs up "long stringy yellow mucus". He has gone through a whole box of mucinex. No fever. He is going to go get the chest xray now.

## 2022-05-06 NOTE — Telephone Encounter (Signed)
Dr. Simona Huh please advise. When he was here last he received Depo and doxycyline. Dr. Edison Pace is out of office.

## 2022-05-06 NOTE — Telephone Encounter (Signed)
Rx sent pt informed

## 2022-05-06 NOTE — Telephone Encounter (Signed)
Patient called stating he seen Dr. Edison Pace on 04-27-2022. He is not any better with his symptoms from this visit asking for the nurse to call needed to know what Dr. Edison Pace would like him to do going forward please advise

## 2022-05-06 NOTE — Progress Notes (Signed)
Thank you! Can we have someone call him on Monday to check in? If he is no better, I have sick visit open in afternoon.

## 2022-05-06 NOTE — Telephone Encounter (Signed)
Okay-let's go ahead and send in some prednisone for him-40 mg daily for 5 days.  I will let him know about additional antibiotics based on his CXR.

## 2022-05-09 ENCOUNTER — Telehealth: Payer: Self-pay | Admitting: Internal Medicine

## 2022-05-09 NOTE — Telephone Encounter (Signed)
Placed patient on Dr. Edison Pace schedule for 05/11/22 at 11:00a.am and notified patient.  Caleb Mills [phone]

## 2022-05-09 NOTE — Telephone Encounter (Signed)
Pt states he has been on prednisone 8 days and he does not see any change. He has an appt for xolair Wednesday 11/8 and wants to know if he should schedule an appt.

## 2022-05-10 DIAGNOSIS — J4550 Severe persistent asthma, uncomplicated: Secondary | ICD-10-CM | POA: Diagnosis not present

## 2022-05-11 ENCOUNTER — Ambulatory Visit (INDEPENDENT_AMBULATORY_CARE_PROVIDER_SITE_OTHER): Payer: Medicare Other | Admitting: Internal Medicine

## 2022-05-11 ENCOUNTER — Other Ambulatory Visit (HOSPITAL_COMMUNITY): Payer: Self-pay

## 2022-05-11 ENCOUNTER — Encounter: Payer: Self-pay | Admitting: Internal Medicine

## 2022-05-11 ENCOUNTER — Ambulatory Visit: Payer: Medicare Other

## 2022-05-11 ENCOUNTER — Telehealth: Payer: Self-pay

## 2022-05-11 VITALS — BP 126/72 | HR 92 | Temp 98.2°F | Resp 19

## 2022-05-11 DIAGNOSIS — J4489 Other specified chronic obstructive pulmonary disease: Secondary | ICD-10-CM

## 2022-05-11 DIAGNOSIS — J4550 Severe persistent asthma, uncomplicated: Secondary | ICD-10-CM | POA: Diagnosis not present

## 2022-05-11 DIAGNOSIS — Z952 Presence of prosthetic heart valve: Secondary | ICD-10-CM

## 2022-05-11 DIAGNOSIS — K219 Gastro-esophageal reflux disease without esophagitis: Secondary | ICD-10-CM

## 2022-05-11 DIAGNOSIS — J3089 Other allergic rhinitis: Secondary | ICD-10-CM | POA: Diagnosis not present

## 2022-05-11 DIAGNOSIS — H1045 Other chronic allergic conjunctivitis: Secondary | ICD-10-CM

## 2022-05-11 MED ORDER — IPRATROPIUM-ALBUTEROL 0.5-2.5 (3) MG/3ML IN SOLN: 3.0000 mL | RESPIRATORY_TRACT | 3 refills | Status: DC | PRN

## 2022-05-11 MED ORDER — AMOXICILLIN-POT CLAVULANATE 875-125 MG PO TABS: 1.0000 | ORAL_TABLET | Freq: Two times a day (BID) | ORAL | 0 refills | Status: AC

## 2022-05-11 NOTE — Addendum Note (Signed)
Addended by: Ralph Leyden on: 05/11/2022 01:26 PM   Modules accepted: Orders

## 2022-05-11 NOTE — Telephone Encounter (Signed)
Received a fax from [facility] regarding Prior Authorization for Ipratropium-Albuterol 0.5-2.5 (3)MG/3ML solution .   Authorization has been DENIED due to is used in a nebulizer. A nebulizer is a piece of durable medical equipment (DME). Drugs used with DME in the home are covered under Medicare Part B. Our records show that you do not live in a long term care (LTC) facility. We cannot pay for drugs under Medicare Part D if they are covered under Medicare Part A or B. We did not decide whether IPRATROPIUM-ALBUTEROL Solution is medically necessary. We made our decision only on the fact that we cannot pay for the drug under Medicare Part D. For more information, talk to your prescriber or call 1- 800-MEDICARE. The criteria we used to make this decision is the Centers of Medicare and Medicaid Services (CMS) guidelines-Prescription Drug Benefit Manual (Chapter 6, Appendix C Medicare Part B versus Part D Coverage Issues).

## 2022-05-11 NOTE — Patient Instructions (Addendum)
Asthma COPD overlap syndrome-with acute flare  Start Augmentin 875mg  twice day for 14 days  Continue montelukast 10 mg once a day to prevent cough or wheeze Continue Yulperi  Continue budesonide 0.5 mg 2 times a day via nebulizer and Perforomist twice a day via nebulizer. Continue duonebs every 4-6 hours  Continue Xolair 225 mg injections once every 2 weeks and have access to an epinephrine autoinjector  Follow up with pulmonary as planned  Consider switching biologics to dupixent    Allergic rhinitis-chronic and stable Continue Flonase 2 sprays in each nostril once a day as needed for a stuffy nose Continue azelastine 2 sprays in each nostril twice a day as needed for a runny nose Consider saline nasal rinses as needed for nasal symptoms. Use this before any medicated nasal sprays for best result Continue Claritin 10 mg once a day as needed for a runny nose Continue Mucinex 1200 mg twice a day and increase hydration as possible Will refer to ENT for evaluation of chronic sinusitis   Allergic conjunctivitis-chronic and stable Continue a lubricating eye drop Some over the counter eye drops include Pataday one drop in each eye once a day as needed for red, itchy eyes OR Zaditor one drop in each eye twice a day as needed for red itchy eyes. Continue to follow with ophthalmology for cataracts   Reflux-chronic and stable Continue famotidine 20 mg twice a day to control reflux Continue dietary and lifestyle modifications as listed below Continue to follow-up with Dr. , GI specialist, as recommended  Follow up: 3 months   Thank you so much for letting me partake in your care today.  Don't hesitate to reach out if you have any additional concerns!  [name], MD  [facility] [place] [place]

## 2022-05-11 NOTE — Telephone Encounter (Signed)
SENT IN RX UNDER PART B WITH J 45.50 AS DX

## 2022-05-11 NOTE — Telephone Encounter (Signed)
Patient Advocate Encounter   Received notification from [facility] that prior authorization is required for Ipratropium-Albuterol 0.5-2.5 (3)MG/3ML solution  Submitted: 05-11-2022 Key [id number]  Status is pending

## 2022-05-11 NOTE — Progress Notes (Signed)
Follow Up Note  RE: Caleb Mills MRN: [id number] DOB: [birth year] Date of Office Visit: 05/11/2022  Referring provider: Charlette Caffey, MD Primary care provider: Charlette Caffey, MD  Chief Complaint: Follow-up (Pt states his symptoms have gotten worst since LOV, he's been coughing so much that his ribs & back hurts), Cough, Asthma, and COPD  History of Present Illness: I had the pleasure of seeing Caleb Mills for a follow up visit at the [facility] on 05/11/2022. He is a 71 y.o. 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 His previous allergy office visit was on 04/27/2022 with Dr. [name]. Today is a  acute visit. [name]  History obtained from patient, chart review.  Last visit he reported recurrence of his upper and lower respiratory airway symptoms after flu shot.  He was given doxycycline and Depo.  He reports no improvement in his symptoms.  He contacted the clinic on 05/06/2022 for persistent symptoms.  Chest x-ray obtained which showed right hemidiaphragm and bilateral atelectasis.  He was given additional dose of prednisone.  He also reports using Mucinex.  Denies any noncompliance with his inhaled ICS, LABA, LAMA nebulized.  He does not like to use inhalers.  He reports that he is continually wake up in the middle of the night due to drainage and feeling is triggering his cough.  He has pulmonary follow-up due in June.  He is reluctant but hesitantly open to seeing ENT again.  He is on Xolair 25 mg every 4 weeks.  Previously failed his [place].  He is very hesitant to try Dupixent.  Pertinent history/previous diagnostics: Asthma/COPD overlap historically uncontrolled-currently on Xolair, has been on benralizumab in the past.  Trial of Tezspire (felt his asthma got worse after trying this).  Has been referred to Dr. [name] at the severe asthma clinic.  However, his original appointment was  canceled because she has been out on extended leave.  He was rescheduled with another physician in the pulmonary group and was evaluated by the pulmonary group (Dr. [name]).   He says he hasn't been well since starting Tezspire even though he only received 1 dose.  BP, HR and congestion worsened.  Airways tightened up.  He tried [place] but it didn't seem to do anything for him.  Interestingly, his symptoms worsening coincides several months after having COVID in fall 2020.  He reportedly did well during COVID itself.     He feels Xolair has helped him with his breathing the most. .  He feels that nebulizers are more helpful than inhalers.  Reports that he has tried every inhaler available.     -High Res Chest CT 07/30/21: IMPRESSION:  1. No findings to suggest interstitial lung disease.  2. Aortic atherosclerosis, in addition to left main and three-vessel  coronary artery disease. Assessment for potential risk factor  modification, dietary therapy or pharmacologic therapy may be  warranted, if clinically indicated.  3. There are calcifications of the mitral annulus. Echocardiographic  correlation for evaluation of potential valvular dysfunction may be  warranted if clinically indicated.  4. Status post mechanical aortic valve replacement.  5. Additional incidental findings, as above.    Pulmonary visit 07/14/21: "Plan: Stopped the Perforomist and Pulmicort as these are not controlling his asthma. Switch to Trelegy. -Check CBC with differential -Check IgE -Check Aspergillus -Check ANCA -6-minute walk gen -HRCT . -Refer to pulmonary rehab -  FU 4  weeks . Prior CT from last year showed some basilar scarring. This suggests he may have an element of interstitial lung disease and pulmonary fibrosis.-HRCT chest" -Full PFT  07/14/21:Severe obstructive defect and air trapping is indicated on this PFT. There is significant bronchodilator response. Normal diffusing capacity Visit 08/06/21: asmanex added,  continued anoro and albuterol, HRCT without evidence of ILD or ABPA, Aspergillus weakly positive, CBC and ANCA not obtained. FU 3 months - 11/11/21: Cardiology appointment- reported negative echo and stress test at [facility] not available for review; plan to obtain records; enrolled in INR clinic - Pulmonary visit 02/16/22: Yulperi started, CBC, ANCA, IgE ordered but not done.   Assessment and Plan: Caleb Mills is a 71 y.o. male with: Severe persistent asthma, unspecified whether complicated Plan: Patient Instructions  Asthma COPD overlap syndrome-with acute flare  Start Augmentin 875mg  twice day for 14 days  Continue montelukast 10 mg once a day to prevent cough or wheeze Continue Yulperi  Continue budesonide 0.5 mg 2 times a day via nebulizer and Perforomist twice a day via nebulizer. Continue duonebs every 4-6 hours  Continue Xolair 225 mg injections once every 2 weeks and have access to an epinephrine autoinjector  Follow up with pulmonary as planned  Consider switching biologics to dupixent    Allergic rhinitis-chronic and stable Continue Flonase 2 sprays in each nostril once a day as needed for a stuffy nose Continue azelastine 2 sprays in each nostril twice a day as needed for a runny nose Consider saline nasal rinses as needed for nasal symptoms. Use this before any medicated nasal sprays for best result Continue Claritin 10 mg once a day as needed for a runny nose Continue Mucinex 1200 mg twice a day and increase hydration as possible  Allergic conjunctivitis-chronic and stable Continue a lubricating eye drop Some over the counter eye drops include Pataday one drop in each eye once a day as needed for red, itchy eyes OR Zaditor one drop in each eye twice a day as needed for red itchy eyes. Continue to follow with ophthalmology for cataracts   Reflux-chronic and stable Continue famotidine 20 mg twice a day to control reflux Continue dietary and lifestyle modifications as  listed below Continue to follow-up with Dr. , GI specialist, as recommended  Follow up: 3 months   Thank you so much for letting me partake in your care today.  Don't hesitate to reach out if you have any additional concerns!  [name], MD  [facility] [place] [place] No follow-ups on file.  Meds ordered this encounter  Medications   ipratropium-albuterol (DUONEB) 0.5-2.5 (3) MG/3ML SOLN    Sig: Take 3 mLs by nebulization every 4 (four) hours as needed.    Dispense:  360 mL    Refill:  3   amoxicillin-clavulanate (AUGMENTIN) 875-125 MG tablet    Sig: Take 1 tablet by mouth 2 (two) times daily for 14 days.    Dispense:  28 tablet    Refill:  0    Lab Orders  No laboratory test(s) ordered today   Diagnostics: None done     Medication List:  Current Outpatient Medications  Medication Sig Dispense Refill   albuterol (PROVENTIL) (2.5 MG/3ML) 0.083% nebulizer solution USE 1 VIAL VIA NEBULIZER EVERY 4 HOURS AS NEEDED FOR WHEEZING OR SHORTNESS OF BREATH, 150 mL 1   albuterol (VENTOLIN HFA) 108 (90 Base) MCG/ACT inhaler USE 2 INHALATIONS BY MOUTH  EVERY 4 HOURS AS NEEDED FOR WHEEZING OR SHORTNESS  OF  BREATH 1 each 1   amoxicillin-clavulanate (AUGMENTIN) 875-125 MG tablet Take 1 tablet by mouth 2 (two) times daily for 14 days. 28 tablet 0   budesonide (PULMICORT) 0.5 MG/2ML nebulizer solution Use vial per treatment. 120 mL 2   Cholecalciferol 25 MCG (1000 UT) tablet Take by mouth.     EPINEPHrine 0.3 mg/0.3 mL IJ SOAJ injection Inject 0.3 mg into the muscle as needed. 1 each 2   famotidine (PEPCID) 20 MG tablet Take one tablet twice daily for reflux 180 tablet 3   fluticasone (FLONASE) 50 MCG/ACT nasal spray Place 1 spray into both nostrils as needed (patient will take one to two sprays each nostril depending upon the weather).      ipratropium-albuterol (DUONEB) 0.5-2.5 (3) MG/3ML SOLN Take 3 mLs by nebulization every 4 (four) hours as needed. 360 mL 3    levothyroxine (SYNTHROID) 75 MCG tablet Take 75 mcg by mouth daily.     loratadine (CLARITIN) 10 MG tablet Take 10 mg by mouth daily.     montelukast (SINGULAIR) 10 MG tablet TAKE 1 TABLET BY MOUTH AT  NIGHT TO PREVENT COUGHING  OR WHEEZING 90 tablet 4   Multiple Vitamins-Minerals (ZINC PO) Take by mouth.     Omega-3 1000 MG CAPS Take by mouth.     PERFOROMIST 20 MCG/2ML nebulizer solution Use 1 vial via nebulizer twice daily 360 mL 3   pravastatin (PRAVACHOL) 40 MG tablet Take 1 tablet by mouth daily.     predniSONE (DELTASONE) 20 MG tablet Take 2 tablets (40 mg total) by mouth daily for 5 days. 10 tablet 0   VITAMIN A PO Take by mouth daily.     VITAMIN D PO Take by mouth daily.     warfarin (COUMADIN) 5 MG tablet Take by mouth.     [name] 175 MCG/3ML nebulizer solution SMARTSIG:3 Milliliter(s) Via Nebulizer Daily     Current Facility-Administered Medications  Medication Dose Route Frequency Provider Last Rate Last Admin   omalizumab Geoffry Paradise) injection 225 mg  225 mg Subcutaneous Q14 Days Hetty Blend, FNP   225 mg at 05/11/22 1043   Allergies: Allergies  Allergen Reactions   Augmentin [Amoxicillin-Pot Clavulanate] Nausea Only   Ciprofloxacin Other (See Comments)    Joint pain   Azelastine Other (See Comments)    Dry eyes   I reviewed his past medical history, social history, family history, and environmental history and no significant changes have been reported from his previous visit.  ROS: All others negative except as noted per HPI.   Objective: BP 126/72   Pulse 92   Temp 98.2 F (36.8 C) (Temporal)   Resp 19   SpO2 97%  There is no height or weight on file to calculate BMI. General Appearance:  Alert, cooperative, no distress, appears stated age  Head:  Normocephalic, without obvious abnormality, atraumatic  Eyes:  Conjunctiva clear, EOM's intact  Nose: Nares normal,  erythematous nasal mucosa, no visible anterior polyps, and septum midline  Throat: Lips, tongue  normal; teeth and gums normal, + cobblestoning  Neck: Supple, symmetrical  Lungs:   Improved and expiratory wheezing , Respirations unlabored, intermittent dry coughing  Heart:  regular rate and rhythm and no murmur, Appears well perfused  Extremities: No edema  Skin: Skin color, texture, turgor normal, no rashes or lesions on visualized portions of skin   Neurologic: No gross deficits   Previous notes and tests were reviewed. The plan was reviewed with the patient/family, and all  questions/concerned were addressed.  It was my pleasure to see Bookert today and participate in his care. Please feel free to contact me with any questions or concerns.  Sincerely,  Ferol Luz, MD  Allergy & Immunology  [facility] Office: [phone]

## 2022-05-16 ENCOUNTER — Other Ambulatory Visit: Payer: Self-pay | Admitting: Internal Medicine

## 2022-05-16 DIAGNOSIS — J4550 Severe persistent asthma, uncomplicated: Secondary | ICD-10-CM

## 2022-05-17 ENCOUNTER — Other Ambulatory Visit (HOSPITAL_COMMUNITY): Payer: Self-pay

## 2022-05-19 ENCOUNTER — Other Ambulatory Visit: Payer: Self-pay | Admitting: Family Medicine

## 2022-05-23 DIAGNOSIS — J4550 Severe persistent asthma, uncomplicated: Secondary | ICD-10-CM | POA: Diagnosis not present

## 2022-05-24 ENCOUNTER — Ambulatory Visit (INDEPENDENT_AMBULATORY_CARE_PROVIDER_SITE_OTHER): Payer: Medicare Other

## 2022-05-24 DIAGNOSIS — J4550 Severe persistent asthma, uncomplicated: Secondary | ICD-10-CM

## 2022-06-07 ENCOUNTER — Ambulatory Visit (INDEPENDENT_AMBULATORY_CARE_PROVIDER_SITE_OTHER): Payer: Medicare Other

## 2022-06-07 DIAGNOSIS — J4550 Severe persistent asthma, uncomplicated: Secondary | ICD-10-CM

## 2022-06-21 ENCOUNTER — Ambulatory Visit (INDEPENDENT_AMBULATORY_CARE_PROVIDER_SITE_OTHER): Payer: Medicare Other

## 2022-06-21 DIAGNOSIS — J4550 Severe persistent asthma, uncomplicated: Secondary | ICD-10-CM

## 2022-07-08 ENCOUNTER — Ambulatory Visit (INDEPENDENT_AMBULATORY_CARE_PROVIDER_SITE_OTHER): Payer: Medicare Other

## 2022-07-08 DIAGNOSIS — J4550 Severe persistent asthma, uncomplicated: Secondary | ICD-10-CM

## 2022-07-19 ENCOUNTER — Other Ambulatory Visit: Payer: Self-pay | Admitting: Internal Medicine

## 2022-07-21 ENCOUNTER — Telehealth: Payer: Self-pay

## 2022-07-21 NOTE — Telephone Encounter (Signed)
Please refer to previous message. This patient's albuterol 0.083 % is due for a Yearly PA. Thank you

## 2022-07-21 NOTE — Telephone Encounter (Signed)
Patient calling wondering why he couldn't get his albuterol 0.083% so I called the pharmacy and the medication needed a yearly PA. Told patient it will go to our PA team if he hasn't heard anything in a few days to call the office. Patient said he had enough albuterol 0.083% at this time.

## 2022-07-22 ENCOUNTER — Other Ambulatory Visit (HOSPITAL_COMMUNITY): Payer: Self-pay

## 2022-07-22 ENCOUNTER — Ambulatory Visit (INDEPENDENT_AMBULATORY_CARE_PROVIDER_SITE_OTHER): Payer: Medicare Other

## 2022-07-22 DIAGNOSIS — J4550 Severe persistent asthma, uncomplicated: Secondary | ICD-10-CM | POA: Diagnosis not present

## 2022-07-22 NOTE — Telephone Encounter (Signed)
Per test claim this medication must be submitted to Part B coverage.

## 2022-07-26 NOTE — Telephone Encounter (Signed)
Spoke to [place] at [facility] [place] to let her know that our PA team said  pharmacy needs to run the albuterol 0.083% through his medicare part B. Jessica did with the J45.40 code severe persistent asthma unspecified still did not go through. Janett Billow will call medicare to see what exactly needs to be put in for the albuterol 0.083% to go through. Janett Billow called medicare and they will fax a form for one of our Dr.'s to sign, then fax back. His albuterol 0.083% should go through then. Patient aware and will call patient when it goes through

## 2022-07-26 NOTE — Telephone Encounter (Signed)
Spoke to [place] at [employer] to let her know the form was not fax'd. [facility] will call medicare tomorrow and have them fax a s.o.w. form to [facility] and [facility] will fax the s.o.w. to [place] for the Dr. To sign. Patient aware.

## 2022-07-27 NOTE — Telephone Encounter (Signed)
Medicare form received from [facility] Dr. Edison Pace signed and faxed back to [facility]

## 2022-07-27 NOTE — Telephone Encounter (Signed)
Refer to previous note. Albuterol 0.083% sent order into [facility] [place]. I notified the patient.

## 2022-07-29 NOTE — Progress Notes (Unsigned)
FOLLOW UP Date of Service/Encounter:  08/01/22   Subjective:  Caleb Mills (DOB: [birth year]) is a 72 y.o. 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  For Review, LV was on 05/11/22  with Dr. [name] seen for routine follow-up. He was treated with Augmentin for uncontrolled asthma symptoms, advised to continue following with pulmonary and consider switching biologics to dupixent, but he is reluctant to switch to biologics.   Pertinent history/previous diagnostics: Asthma/COPD overlap historically uncontrolled-currently on Xolair, has been on benralizumab in the past.  Trial of Tezspire (felt his asthma got worse after trying this).  Has been referred to Dr. [name] at the [facility] asthma clinic.  However, his original appointment was canceled because she has been out on extended leave.  He was rescheduled with another physician in the pulmonary group and was evaluated by the pulmonary group (Dr. [name]).   He says he hasn't been well since starting Tezspire even though he only received 1 dose.  BP, HR and congestion worsened.  Airways tightened up.  He tried [place] but it didn't seem to do anything for him.  Interestingly, his symptoms worsening coincides several months after having COVID in fall 2020.  He reportedly did well during COVID itself.     He feels Xolair has helped him with his breathing the most. .  He feels that nebulizers are more helpful than inhalers.  Reports that he has tried every inhaler available.     -High Res Chest CT 07/30/21: IMPRESSION:  1. No findings to suggest interstitial lung disease.  2. Aortic atherosclerosis, in addition to left main and three-vessel  coronary artery disease. Assessment for potential risk factor  modification, dietary therapy or pharmacologic therapy may be  warranted, if  clinically indicated.  3. There are calcifications of the mitral annulus. Echocardiographic  correlation for evaluation of potential valvular dysfunction may be  warranted if clinically indicated.  4. Status post mechanical aortic valve replacement.  5. Additional incidental findings, as above.    Pulmonary visit 07/14/21: "Plan: Stopped the Perforomist and Pulmicort as these are not controlling his asthma. Switch to Trelegy. -Check CBC with differential -Check IgE -Check Aspergillus -Check ANCA -6-minute walk gen -HRCT . -Refer to pulmonary rehab -  FU 4 weeks . Prior CT from last year showed some basilar scarring. This suggests he may have an element of interstitial lung disease and pulmonary fibrosis.-HRCT chest" -Full PFT  07/14/21:Severe obstructive defect and air trapping is indicated on this PFT. There is significant bronchodilator response. Normal diffusing capacity Visit 08/06/21: asmanex added, continued anoro and albuterol, HRCT without evidence of ILD or ABPA, Aspergillus weakly positive, CBC and ANCA not obtained. FU 3 months - 11/11/21: Cardiology appointment- reported negative echo and stress test at [facility] not available for review; plan to obtain records; enrolled in INR clinic - Pulmonary visit 02/16/22: Yulperi started, CBC, ANCA, IgE ordered but not done.   Today presents for follow-up. Since getting the high-dose flu vaccine in October, his lung function has been down.  He reports about a week after getting the injection he started having increased chest tightness body aches-symptoms consistent with the flu. Around that time was evaluated by [place] and given rounds of antibiotics and prednisone which he did not feel were helpful. He does monitor his peak flow and states that prior to this was reaching  around 300, afterward has not been able to get above 270. He was using DuoNebs at that time, and felt that this made him have a worsening dry cough. He did have some leftover  azithromycin from when we did a trial of azithromycin prophylaxis for asthma, and he states that this eventually dried up the cough and mucus he was having.  He is no longer taking this. Previously when used as prophylaxis, did not find helpful  He did restart one week ago, the DuoNebs in the middle of the afternoon without mixing with other nebulized medications.  His peak spirometry has increased to 270 which is an improvement from where he was at. He is taking his yupleri, budesonide and perforomist as prescribed.  He reports that his cardiologist did put him on low-dose prednisone of 5 mg daily, and he noted no improvement in his symptoms.  He does not want to take daily steroids because of a fear that when he needs burst they will not be as effective. He reports that prior to October he was using a treadmill, but has not been able to use again since this occurred.  He does stay active with grandchildren, but has not been able to regain his previous endurance. Additionally he reports that where he orders his vitamins, he was able to order an oxygen canister.  He notes that when he walks across his property to put feed out for deer, he feels significant short of breath on return. Using the oxygen canister makes him feel recovered faster than using his albuterol. He does follow-up with Pulmonary in mid-February. He continues to use his nebulizer with albuterol around 2-3 times nightly.   Allergies as of 08/01/2022       Reactions   Augmentin [amoxicillin-pot Clavulanate] Nausea Only   Ciprofloxacin Other (See Comments)   Joint pain   Azelastine Other (See Comments)   Dry eyes        Medication List        Accurate as of August 01, 2022 10:11 AM. If you have any questions, ask your nurse or doctor.          STOP taking these medications    ipratropium-albuterol 0.5-2.5 (3) MG/3ML Soln Commonly known as: DUONEB Stopped by: Clemon Chambers, MD       TAKE these medications     albuterol (2.5 MG/3ML) 0.083% nebulizer solution Commonly known as: PROVENTIL USE 1 VIAL VIA NEBULIZER EVERY 4 HOURS AS NEEDED FOR WHEEZING OR SHORTNESS OF BREATH   albuterol 108 (90 Base) MCG/ACT inhaler Commonly known as: VENTOLIN HFA INHALE 2 PUFFS INTO THE LUNGS EVERY 4 HOURS AS NEEDED FOR WHEEZING OR SHORTNESS OF BREATH   budesonide 0.5 MG/2ML nebulizer solution Commonly known as: PULMICORT USE 2 ML(0.5 MG) VIA NEBULIZER TWICE DAILY   Cholecalciferol 25 MCG (1000 UT) tablet Take by mouth.   EPINEPHrine 0.3 mg/0.3 mL Soaj injection Commonly known as: EPI-PEN Inject 0.3 mg into the muscle as needed.   famotidine 20 MG tablet Commonly known as: PEPCID Take one tablet twice daily for reflux   fluticasone 50 MCG/ACT nasal spray Commonly known as: FLONASE Place 1 spray into both nostrils as needed (patient will take one to two sprays each nostril depending upon the weather).   levothyroxine 75 MCG tablet Commonly known as: SYNTHROID Take 75 mcg by mouth daily.   loratadine 10 MG tablet Commonly known as: CLARITIN Take 10 mg by mouth daily.   montelukast 10 MG tablet Commonly known as:  SINGULAIR TAKE 1 TABLET BY MOUTH AT  NIGHT TO PREVENT COUGHING  OR WHEEZING   Omega-3 1000 MG Caps Take by mouth.   Perforomist 20 MCG/2ML nebulizer solution Generic drug: formoterol USE 1 VIAL VIA NEBULIZER TWICE DAILY   pravastatin 40 MG tablet Commonly known as: PRAVACHOL Take 1 tablet by mouth daily.   VITAMIN A PO Take by mouth daily.   VITAMIN D PO Take by mouth daily.   warfarin 5 MG tablet Commonly known as: COUMADIN Take by mouth.   [name] 175 MCG/3ML nebulizer solution Generic drug: revefenacin SMARTSIG:3 Milliliter(s) Via Nebulizer Daily   ZINC PO Take by mouth.       Past Medical History:  Diagnosis Date   Asthma    S/P total hip resurfacing 2009   left hip   Past Surgical History:  Procedure Laterality Date   AORTIC VALVE REPLACEMENT      APPENDECTOMY  [place]     HIP RESURFACING Left 2009   HIP RESURFACING  2014   NASAL SINUS SURGERY     REPLACEMENT TOTAL HIP W/  RESURFACING IMPLANTS Right 2009   ROTATOR CUFF REPAIR Right 2009   THYROIDECTOMY  2003   TOTAL HIP ARTHROPLASTY Right july 2014   Otherwise, there have been no changes to his past medical history, surgical history, family history, or social history.  ROS: All others negative except as noted per HPI.   Objective:  BP 118/74 (BP Location: Left Arm, Patient Position: Sitting, Cuff Size: Normal)   Pulse 75   Resp 18   Ht 5\' 5"  (1.651 m)   Wt 155 lb 14.4 oz (70.7 kg)   SpO2 97%   BMI 25.94 kg/m  Body mass index is 25.94 kg/m. Physical Exam: General Appearance:  Alert, cooperative, no distress, appears stated age  Head:  Normocephalic, without obvious abnormality, atraumatic  Eyes:  Conjunctiva clear, EOM's intact  Nose: Nares normal, normal mucosa and no visible anterior polyps  Throat: Lips, tongue normal; teeth and gums normal, normal posterior oropharynx  Neck: Supple, symmetrical  Lungs:   clear to auscultation bilaterally, Respirations unlabored, no coughing  Heart:  regular rate and rhythm and no murmur, Appears well perfused  Extremities: No edema  Skin: Skin color, texture, turgor normal, no rashes or lesions on visualized portions of skin  Neurologic: No gross deficits  Spirometry:  Tracings reviewed. His effort: Good reproducible efforts. FVC: 2.29L FEV1: 0.75L, 29% predicted FEV1/FVC ratio: 43% Interpretation: Spirometry consistent with severe obstructive disease. And restriction Please see scanned spirometry results for details.  Assessment/Plan  : Lung function which has worsened following what sounds like an upper respiratory infection in October which he feels is related to the high-dose flu vaccine. He has historically not been open to trying different combinations of inhalers and/or  different asthma Biologics due to perceived worsening of symptoms after trying new medications in the past. He is not open to trying daily prednisone at this time given fear of it being ineffective when required during bursts. He questions whether oxygen would be helpful for him, but does have follow-up with pulmonary next month. I am therefore not going to make any changes to his plan at this time.  We discussed that his lung function will likely continue to take longer to recover after each illness or large inflammatory event that occurs, and he may not return to his previous baseline. Would greatly appreciate help in managing his case with  pulmonary given his COPD overlap and likely need for oxygen therapy at some point in the future.  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 Continue albuterol every 4 to 6 hours as needed.  Continue Xolair 225 mg injections once every 2 weeks and have access to an epinephrine autoinjector  Follow up with pulmonary as planned  Consider switching biologics to dupixent   Allergic rhinitis-chronic and stable Continue Flonase 2 sprays in each nostril once a day as needed for a stuffy nose Continue azelastine 2 sprays in each nostril twice a day as needed for a runny nose Consider saline nasal rinses as needed for nasal symptoms. Use this before any medicated nasal sprays for best result Continue Claritin 10 mg once a day as needed for a runny nose Continue Mucinex 1200 mg twice a day and increase hydration as possible  Allergic conjunctivitis-chronic and stable Continue a lubricating eye drop Some over the counter eye drops include Pataday one drop in each eye once a day as needed for red, itchy eyes OR Zaditor one drop in each eye twice a day as needed for red itchy  eyes. Continue to follow with ophthalmology for cataracts   Reflux-chronic and stable Continue famotidine 20 mg twice a day to control reflux Continue dietary and lifestyle modifications as listed below Continue to follow-up with Dr. Lyndel Safe, GI specialist, as recommended  Follow up: 6 months, sooner if needed  Thank you so much for letting me partake in your care today.  Don't hesitate to reach out if you have any additional concerns!  Sigurd Sos, MD  [facility]

## 2022-08-01 ENCOUNTER — Other Ambulatory Visit: Payer: Self-pay

## 2022-08-01 ENCOUNTER — Ambulatory Visit (INDEPENDENT_AMBULATORY_CARE_PROVIDER_SITE_OTHER): Payer: Medicare Other | Admitting: Internal Medicine

## 2022-08-01 ENCOUNTER — Encounter: Payer: Self-pay | Admitting: Internal Medicine

## 2022-08-01 VITALS — BP 118/74 | HR 75 | Resp 18 | Ht 65.0 in | Wt 155.9 lb

## 2022-08-01 DIAGNOSIS — J4550 Severe persistent asthma, uncomplicated: Secondary | ICD-10-CM | POA: Diagnosis not present

## 2022-08-01 DIAGNOSIS — J3089 Other allergic rhinitis: Secondary | ICD-10-CM

## 2022-08-01 DIAGNOSIS — H1013 Acute atopic conjunctivitis, bilateral: Secondary | ICD-10-CM

## 2022-08-01 DIAGNOSIS — Z7901 Long term (current) use of anticoagulants: Secondary | ICD-10-CM

## 2022-08-01 DIAGNOSIS — R0602 Shortness of breath: Secondary | ICD-10-CM | POA: Diagnosis not present

## 2022-08-01 DIAGNOSIS — J4489 Other specified chronic obstructive pulmonary disease: Secondary | ICD-10-CM

## 2022-08-01 DIAGNOSIS — J4551 Severe persistent asthma with (acute) exacerbation: Secondary | ICD-10-CM

## 2022-08-01 DIAGNOSIS — K219 Gastro-esophageal reflux disease without esophagitis: Secondary | ICD-10-CM

## 2022-08-01 DIAGNOSIS — H1010 Acute atopic conjunctivitis, unspecified eye: Secondary | ICD-10-CM

## 2022-08-01 MED ORDER — ALBUTEROL SULFATE HFA 108 (90 BASE) MCG/ACT IN AERS: INHALATION_SPRAY | RESPIRATORY_TRACT | 2 refills | Status: DC

## 2022-08-01 MED ORDER — PERFOROMIST 20 MCG/2ML IN NEBU: INHALATION_SOLUTION | RESPIRATORY_TRACT | 5 refills | Status: DC

## 2022-08-01 MED ORDER — EPINEPHRINE 0.3 MG/0.3ML IJ SOAJ: 0.3000 mg | INTRAMUSCULAR | 2 refills | Status: DC | PRN

## 2022-08-01 NOTE — Addendum Note (Signed)
Addended by: Isabel Caprice on: 08/01/2022 03:07 PM   Modules accepted: Orders

## 2022-08-01 NOTE — Patient Instructions (Addendum)
Asthma COPD overlap syndrome- Continue montelukast 10 mg once a day to prevent cough or wheeze Continue Yulperi  Continue budesonide 0.5 mg 2 times a day via nebulizer and Perforomist twice a day via nebulizer. Continue albuterol every 4 to 6 hours as needed.  Continue Xolair 225 mg injections once every 2 weeks and have access to an epinephrine autoinjector  Follow up with pulmonary as planned  Consider switching biologics to dupixent    Allergic rhinitis-chronic and stable Continue Flonase 2 sprays in each nostril once a day as needed for a stuffy nose Continue azelastine 2 sprays in each nostril twice a day as needed for a runny nose Consider saline nasal rinses as needed for nasal symptoms. Use this before any medicated nasal sprays for best result Continue Claritin 10 mg once a day as needed for a runny nose Continue Mucinex 1200 mg twice a day and increase hydration as possible  Allergic conjunctivitis-chronic and stable Continue a lubricating eye drop Some over the counter eye drops include Pataday one drop in each eye once a day as needed for red, itchy eyes OR Zaditor one drop in each eye twice a day as needed for red itchy eyes. Continue to follow with ophthalmology for cataracts   Reflux-chronic and stable Continue famotidine 20 mg twice a day to control reflux Continue dietary and lifestyle modifications as listed below Continue to follow-up with Dr. Lyndel Safe, GI specialist, as recommended  Follow up: 6 months, sooner if needed  Thank you so much for letting me partake in your care today.  Don't hesitate to reach out if you have any additional concerns!  Sigurd Sos, MD [facility]

## 2022-08-05 ENCOUNTER — Ambulatory Visit (INDEPENDENT_AMBULATORY_CARE_PROVIDER_SITE_OTHER): Payer: Medicare Other

## 2022-08-05 DIAGNOSIS — J4550 Severe persistent asthma, uncomplicated: Secondary | ICD-10-CM | POA: Diagnosis not present

## 2022-08-09 ENCOUNTER — Other Ambulatory Visit: Payer: Self-pay | Admitting: Internal Medicine

## 2022-08-18 ENCOUNTER — Telehealth: Payer: Self-pay | Admitting: Internal Medicine

## 2022-08-18 ENCOUNTER — Other Ambulatory Visit: Payer: Self-pay | Admitting: Internal Medicine

## 2022-08-18 MED ORDER — ALBUTEROL SULFATE (2.5 MG/3ML) 0.083% IN NEBU: 2.5000 mg | INHALATION_SOLUTION | RESPIRATORY_TRACT | 1 refills | Status: DC | PRN

## 2022-08-18 NOTE — Telephone Encounter (Signed)
Patient called asking for a new Rx on albuterol (PROVENTIL) (2.5 MG/3ML) 0.083% nebulizer solution [id number] please advise

## 2022-08-18 NOTE — Telephone Encounter (Signed)
Rx sent to [facility] on file.

## 2022-08-19 ENCOUNTER — Ambulatory Visit: Payer: Medicare Other

## 2022-08-22 ENCOUNTER — Ambulatory Visit (INDEPENDENT_AMBULATORY_CARE_PROVIDER_SITE_OTHER): Payer: Medicare Other

## 2022-08-22 DIAGNOSIS — J4550 Severe persistent asthma, uncomplicated: Secondary | ICD-10-CM | POA: Diagnosis not present

## 2022-09-05 ENCOUNTER — Ambulatory Visit (INDEPENDENT_AMBULATORY_CARE_PROVIDER_SITE_OTHER): Payer: Medicare Other

## 2022-09-05 DIAGNOSIS — J4550 Severe persistent asthma, uncomplicated: Secondary | ICD-10-CM

## 2022-09-19 ENCOUNTER — Ambulatory Visit (INDEPENDENT_AMBULATORY_CARE_PROVIDER_SITE_OTHER): Payer: Medicare Other

## 2022-09-19 DIAGNOSIS — J4550 Severe persistent asthma, uncomplicated: Secondary | ICD-10-CM

## 2022-09-26 ENCOUNTER — Encounter: Payer: Self-pay | Admitting: Internal Medicine

## 2022-09-26 ENCOUNTER — Other Ambulatory Visit: Payer: Self-pay

## 2022-09-26 ENCOUNTER — Ambulatory Visit (INDEPENDENT_AMBULATORY_CARE_PROVIDER_SITE_OTHER): Payer: Medicare Other | Admitting: Internal Medicine

## 2022-09-26 VITALS — BP 144/70 | HR 72 | Temp 97.9°F | Resp 20

## 2022-09-26 DIAGNOSIS — Z952 Presence of prosthetic heart valve: Secondary | ICD-10-CM

## 2022-09-26 DIAGNOSIS — K219 Gastro-esophageal reflux disease without esophagitis: Secondary | ICD-10-CM | POA: Diagnosis not present

## 2022-09-26 DIAGNOSIS — Z7901 Long term (current) use of anticoagulants: Secondary | ICD-10-CM

## 2022-09-26 DIAGNOSIS — J4551 Severe persistent asthma with (acute) exacerbation: Secondary | ICD-10-CM

## 2022-09-26 DIAGNOSIS — J3089 Other allergic rhinitis: Secondary | ICD-10-CM | POA: Diagnosis not present

## 2022-09-26 DIAGNOSIS — J4489 Other specified chronic obstructive pulmonary disease: Secondary | ICD-10-CM

## 2022-09-26 MED ORDER — AZITHROMYCIN 250 MG PO TABS: ORAL_TABLET | ORAL | 0 refills | Status: AC

## 2022-09-26 MED ORDER — PREDNISONE 20 MG PO TABS: 40.0000 mg | ORAL_TABLET | Freq: Every day | ORAL | 0 refills | Status: AC

## 2022-09-26 NOTE — Patient Instructions (Addendum)
Asthma COPD overlap syndrome-wiht acute exacerbations 40 mg prednisone given in clinic, starting tomorrow, continue 40 mg daily for a total of 5 days Azithromycin 250 mg - today take 2 tablets, then starting tomorrow take 1 tablet daily for the next 21 days. Contact cardiology to let them know you are starting azithromycin as this may make you more prone to bleeding when on warfarin. Dose adjustments may be needed.   Continue montelukast 10 mg once a day to prevent cough or wheeze Continue Yulperi  Continue budesonide 0.5 mg 2 times a day via nebulizer and Perforomist twice a day via nebulizer. Continue albuterol every 4 to 6 hours as needed.  Continue Xolair 225 mg injections once every 2 weeks and have access to an epinephrine autoinjector  Follow up with pulmonary as planned  Consider switching biologics to dupixent   Allergic rhinitis-chronic and stable Continue Flonase 2 sprays in each nostril once a day as needed for a stuffy nose Continue azelastine 2 sprays in each nostril twice a day as needed for a runny nose Consider saline nasal rinses as needed for nasal symptoms. Use this before any medicated nasal sprays for best result Continue Claritin 10 mg once a day as needed for a runny nose Continue Mucinex 1200 mg twice a day and increase hydration as possible  Allergic conjunctivitis-chronic and stable Continue a lubricating eye drop Some over the counter eye drops include Pataday one drop in each eye once a day as needed for red, itchy eyes OR Zaditor one drop in each eye twice a day as needed for red itchy eyes. Continue to follow with ophthalmology for cataracts   Reflux-chronic and stable Continue famotidine 20 mg twice a day to control reflux Continue dietary and lifestyle modifications as listed below Continue to follow-up with Dr. Lyndel Safe, GI specialist, as recommended  Follow up: 6 months, sooner if needed  Thank you so much for letting me partake in your care today.   Don't hesitate to reach out if you have any additional concerns!  Sigurd Sos, MD [facility] [place]

## 2022-09-26 NOTE — Progress Notes (Signed)
FOLLOW UP Date of Service/Encounter:  09/26/22   Subjective:  Caleb Mills (DOB: 12-30-1950) is a 72 y.o. male who returns to the [place] on 09/26/2022 in re-evaluation of the following: sick visit for respiratory  History obtained from: chart review and patient.  For Review, LV was on 08/01/22  with Dr.Lafern Brinkley seen for routine follow-up. 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 FEV1 27% at that visit. Therapeutic plans/changes recommended: none  Pertinent history/previous diagnostics: Asthma/COPD overlap  historically uncontrolled-currently on Xolair, has been on benralizumab in the past.  Trial of Tezspire (felt his asthma got worse after trying this).  Has been referred to Dr. Soledad Gerlach at the severe asthma clinic.  He was rescheduled with another physician in the pulmonary group and was evaluated by Dr. Camillo Flaming He says he hasn't been well since starting Tezspire even though he only received 1 dose. He tried [place] but it didn't seem to do anything for him.  Interestingly, his symptoms worsening coincides several months after having COVID in fall 2020.  He reportedly did well during COVID itself.  Xolair has helped him with his breathing the most. .  He feels that nebulizers are more helpful than inhalers.  Reports that he has tried every inhaler available  High Res Chest CT 07/30/21: IMPRESSION:  1. No findings to suggest interstitial lung disease.  2. Aortic atherosclerosis, in addition to left main and three-vessel  coronary artery disease. Assessment for potential risk factor  modification, dietary therapy or pharmacologic therapy may be  warranted, if clinically indicated.  3. There are calcifications of the mitral annulus. Echocardiographic  correlation for evaluation of potential valvular dysfunction may be  warranted if clinically indicated.  4. Status post mechanical  aortic valve replacement.  5. Additional incidental findings, as above.  Other visits:  -Full PFT  07/14/21:Severe obstructive defect and air trapping is indicated on this PFT. There is significant bronchodilator response. Normal diffusing capacity -visit 08/06/21: asmanex added, continued anoro and albuterol, HRCT without evidence of ILD or ABPA, Aspergillus weakly positive, CBC and ANCA not obtained. FU 3 months - 11/11/21: Cardiology appointment- reported negative echo and stress test at [facility] not available for review; plan to obtain records; enrolled in INR clinic - Pulmonary visit 02/16/22: Yulperi started, CBC, ANCA, IgE ordered but not obtained. - LV pulm 08/31/22-continued yupleri, perforomist, singulair, albuterol, xolair. CBCd checked to r/o anemia due to low DLCO and no anemia present.  Today presents for acute visit-trouble breathing, thick green and yellow mucus which he is expectorating.  Symptoms started over a week ago. He felt is coming on, but it has progressively gotten worse. He is using saline solution nasal rinse throughout the day to keep his nose cleared out. He has been using his inhaler every few hours to get some relief. 8-10 times during the day. 6 times over night. He is not using his nebulzier because he is having trouble getting this covered for some reason.  He is using albuterol, budesonide, yupleri and perforomist as prescribed.  He can not tolerate the inhaler that he states had albuterol and another medications (AirSupra?) as he feels it gives him a hoarse cough without anything coming up.   Last antibiotic use: He had a bottle of azithromycin that was left over from last time he was sick.  He developed flare of his cough and congestion. This was about 2 weeks after getting  his flu shot in October.  He did well without any antibiotics up until this last week.  He does not want a steroid injection.  Last steroids use: in November or December.   He  thinks he picked up a viral illness from one of his grandchildren.  He is on warfarin and has a scheduled INR April 5th. He feels azithromycin has been the most effective course of therapy for him during his flares. He has not had issues with his INR when taking azithromycin in the past and plans to contact his cardiologist today to let them know he will be starting azithromycin.     Allergies as of 09/26/2022       Reactions   Augmentin [amoxicillin-pot Clavulanate] Nausea Only   Ciprofloxacin Other (See Comments)   Joint pain   Azelastine Other (See Comments)   Dry eyes        Medication List        Accurate as of September 26, 2022  1:06 PM. If you have any questions, ask your nurse or doctor.          albuterol 108 (90 Base) MCG/ACT inhaler Commonly known as: VENTOLIN HFA INHALE 2 PUFFS INTO THE LUNGS EVERY 4 HOURS AS NEEDED FOR WHEEZING OR SHORTNESS OF BREATH   albuterol (2.5 MG/3ML) 0.083% nebulizer solution Commonly known as: PROVENTIL Take 3 mLs (2.5 mg total) by nebulization every 4 (four) hours as needed for wheezing or shortness of breath.   azithromycin 250 MG tablet Commonly known as: Zithromax Take 2 tablets on day 1, then 1 tablet daily on days 2 through 21. Started by: Clemon Chambers, MD   budesonide 0.5 MG/2ML nebulizer solution Commonly known as: PULMICORT USE 2 ML(0.5 MG) VIA NEBULIZER TWICE DAILY   Cholecalciferol 25 MCG (1000 UT) tablet Take by mouth.   EPINEPHrine 0.3 mg/0.3 mL Soaj injection Commonly known as: EPI-PEN Inject 0.3 mg into the muscle as needed.   famotidine 20 MG tablet Commonly known as: PEPCID Take one tablet twice daily for reflux   fluticasone 50 MCG/ACT nasal spray Commonly known as: FLONASE Place 1 spray into both nostrils as needed (patient will take one to two sprays each nostril depending upon the weather).   levothyroxine 75 MCG tablet Commonly known as: SYNTHROID Take 75 mcg by mouth daily.   loratadine 10 MG  tablet Commonly known as: CLARITIN Take 10 mg by mouth daily.   montelukast 10 MG tablet Commonly known as: SINGULAIR TAKE 1 TABLET BY MOUTH AT  NIGHT TO PREVENT COUGHING  OR WHEEZING   Omega-3 1000 MG Caps Take by mouth.   Perforomist 20 MCG/2ML nebulizer solution Generic drug: formoterol USE 1 VIAL VIA NEBULIZER TWICE DAILY   pravastatin 40 MG tablet Commonly known as: PRAVACHOL Take 1 tablet by mouth daily.   predniSONE 20 MG tablet Commonly known as: DELTASONE Take 2 tablets (40 mg total) by mouth daily with breakfast for 4 days. Start taking on: September 27, 2022 Started by: Clemon Chambers, MD   traMADol 50 MG tablet Commonly known as: ULTRAM Take 50 mg by mouth 4 (four) times daily as needed.   VITAMIN A PO Take by mouth daily.   VITAMIN D PO Take by mouth daily.   warfarin 5 MG tablet Commonly known as: COUMADIN Take by mouth.   [name] 175 MCG/3ML nebulizer solution Generic drug: revefenacin SMARTSIG:3 Milliliter(s) Via Nebulizer Daily   ZINC PO Take by mouth.       Past Medical  History:  Diagnosis Date   Asthma    S/P total hip resurfacing 2009   left hip   Past Surgical History:  Procedure Laterality Date   AORTIC VALVE REPLACEMENT     APPENDECTOMY  [place]     HIP RESURFACING Left 2009   HIP RESURFACING  2014   NASAL SINUS SURGERY     REPLACEMENT TOTAL HIP W/  RESURFACING IMPLANTS Right 2009   ROTATOR CUFF REPAIR Right 2009   THYROIDECTOMY  2003   TOTAL HIP ARTHROPLASTY Right july 2014   Otherwise, there have been no changes to his past medical history, surgical history, family history, or social history.  ROS: All others negative except as noted per HPI.   Objective:  BP (!) 144/70   Pulse 72   Temp 97.9 F (36.6 C) (Temporal)   Resp 20   SpO2 97%  There is no height or weight on file to calculate BMI. Physical Exam: General Appearance:  Alert, cooperative, no distress, appears stated  age  Head:  Normocephalic, without obvious abnormality, atraumatic  Eyes:  Conjunctiva clear, EOM's intact  Nose: Nares normal, hypertrophic turbinates, normal mucosa, and no visible anterior polyps  Throat: Lips, tongue normal; teeth and gums normal, normal posterior oropharynx  Neck: Supple, symmetrical  Lungs:   wheezing throughout, Respirations unlabored, intermittent dry coughing  Heart:  regular rate and rhythm and no murmur, Appears well perfused  Extremities: No edema  Skin: Skin color, texture, turgor normal and no rashes or lesions on visualized portions of skin  Neurologic: No gross deficits  Spirometry:  Tracings reviewed. His effort: Good reproducible efforts. FVC: 2.61L FEV1: 0.72L, 27% predicted FEV1/FVC ratio: 0.28 Interpretation: Spirometry consistent with mixed obstructive and restrictive disease. Severe. Please see scanned spirometry results for details.  Assessment/Plan   Asthma COPD overlap syndrome-wiht acute exacerbations 40 mg prednisone given in clinic, starting tomorrow, continue 40 mg daily for a total of 5 days Azithromycin 250 mg - today take 2 tablets, then starting tomorrow take 1 tablet daily for the next 21 days. Contact cardiology to let them know you are starting azithromycin as this may make you more prone to bleeding when on warfarin. Dose adjustments may be needed.   Continue montelukast 10 mg once a day to prevent cough or wheeze Continue Yulperi  Continue budesonide 0.5 mg 2 times a day via nebulizer and Perforomist twice a day via nebulizer. Continue albuterol every 4 to 6 hours as needed.  Continue Xolair 225 mg injections once every 2 weeks and have access to an epinephrine autoinjector  Follow up with pulmonary as planned  Consider switching biologics to dupixent   Allergic rhinitis-chronic and stable Continue Flonase 2 sprays in each nostril once a day as needed for a stuffy nose Continue azelastine 2 sprays in each nostril twice a day  as needed for a runny nose Consider saline nasal rinses as needed for nasal symptoms. Use this before any medicated nasal sprays for best result Continue Claritin 10 mg once a day as needed for a runny nose Continue Mucinex 1200 mg twice a day and increase hydration as possible  Allergic conjunctivitis-chronic and stable Continue a lubricating eye drop Some over the counter eye drops include Pataday one drop in each eye once a day as needed for red, itchy eyes OR Zaditor one drop in each eye twice a day as needed for red itchy eyes. Continue to follow with ophthalmology for cataracts  Reflux-chronic and stable Continue famotidine 20 mg twice a day to control reflux Continue dietary and lifestyle modifications as listed below Continue to follow-up with Dr. Lyndel Safe, GI specialist, as recommended  Follow up: 6 months, sooner if needed  Sigurd Sos, MD  [facility] [place] [facility]

## 2022-10-03 ENCOUNTER — Ambulatory Visit (INDEPENDENT_AMBULATORY_CARE_PROVIDER_SITE_OTHER): Payer: Medicare Other | Admitting: *Deleted

## 2022-10-03 DIAGNOSIS — J4550 Severe persistent asthma, uncomplicated: Secondary | ICD-10-CM

## 2022-10-17 ENCOUNTER — Ambulatory Visit (INDEPENDENT_AMBULATORY_CARE_PROVIDER_SITE_OTHER): Payer: Medicare Other

## 2022-10-17 ENCOUNTER — Ambulatory Visit: Payer: Medicare Other

## 2022-10-17 DIAGNOSIS — J4550 Severe persistent asthma, uncomplicated: Secondary | ICD-10-CM

## 2022-10-18 ENCOUNTER — Other Ambulatory Visit: Payer: Self-pay

## 2022-10-18 ENCOUNTER — Telehealth: Payer: Self-pay

## 2022-10-18 MED ORDER — PERFOROMIST 20 MCG/2ML IN NEBU: INHALATION_SOLUTION | RESPIRATORY_TRACT | 5 refills | Status: DC

## 2022-10-18 NOTE — Telephone Encounter (Signed)
Pt came in clinic and wanted to know why he is only gettng 15 days of his performist and not 30. I told him I would reach out to [facility] and see what's going on   Rachel at the pharmacy stated the rx was sent for 60 quantity instead of 120 ml so resnt rx for to last all month long  Lm for pt to call us back about this   Pt called back and stated understanding of this

## 2022-10-31 ENCOUNTER — Ambulatory Visit (INDEPENDENT_AMBULATORY_CARE_PROVIDER_SITE_OTHER): Payer: Medicare Other

## 2022-10-31 DIAGNOSIS — J4550 Severe persistent asthma, uncomplicated: Secondary | ICD-10-CM | POA: Diagnosis not present

## 2022-11-01 ENCOUNTER — Other Ambulatory Visit: Payer: Self-pay | Admitting: Internal Medicine

## 2022-11-14 ENCOUNTER — Ambulatory Visit (INDEPENDENT_AMBULATORY_CARE_PROVIDER_SITE_OTHER): Payer: Medicare Other

## 2022-11-14 DIAGNOSIS — J4550 Severe persistent asthma, uncomplicated: Secondary | ICD-10-CM | POA: Diagnosis not present

## 2022-11-30 ENCOUNTER — Ambulatory Visit (INDEPENDENT_AMBULATORY_CARE_PROVIDER_SITE_OTHER): Payer: Medicare Other

## 2022-11-30 ENCOUNTER — Telehealth: Payer: Self-pay | Admitting: Internal Medicine

## 2022-11-30 ENCOUNTER — Other Ambulatory Visit: Payer: Self-pay

## 2022-11-30 DIAGNOSIS — J4550 Severe persistent asthma, uncomplicated: Secondary | ICD-10-CM

## 2022-11-30 MED ORDER — BUDESONIDE 0.5 MG/2ML IN SUSP: RESPIRATORY_TRACT | 2 refills | Status: DC

## 2022-11-30 NOTE — Telephone Encounter (Signed)
Patient asking for a refill on RX budesonide (PULMICORT) 0.5 MG/2ML nebulizer solution [[id number]] please advise

## 2022-11-30 NOTE — Telephone Encounter (Signed)
Pt just seen dr [name] so sent in refill of budesonide to [facility] for pt

## 2022-12-14 ENCOUNTER — Ambulatory Visit (INDEPENDENT_AMBULATORY_CARE_PROVIDER_SITE_OTHER): Payer: Medicare Other

## 2022-12-14 DIAGNOSIS — J4550 Severe persistent asthma, uncomplicated: Secondary | ICD-10-CM

## 2022-12-16 ENCOUNTER — Other Ambulatory Visit: Payer: Self-pay | Admitting: Internal Medicine

## 2022-12-19 ENCOUNTER — Other Ambulatory Visit: Payer: Self-pay | Admitting: Internal Medicine

## 2022-12-28 ENCOUNTER — Ambulatory Visit (INDEPENDENT_AMBULATORY_CARE_PROVIDER_SITE_OTHER): Payer: Medicare Other

## 2022-12-28 DIAGNOSIS — J4550 Severe persistent asthma, uncomplicated: Secondary | ICD-10-CM | POA: Diagnosis not present

## 2023-01-11 ENCOUNTER — Ambulatory Visit (INDEPENDENT_AMBULATORY_CARE_PROVIDER_SITE_OTHER): Payer: Medicare Other

## 2023-01-11 DIAGNOSIS — J4550 Severe persistent asthma, uncomplicated: Secondary | ICD-10-CM

## 2023-01-11 MED ORDER — OMALIZUMAB 150 MG/ML ~~LOC~~ SOSY
300.0000 mg | PREFILLED_SYRINGE | SUBCUTANEOUS | Status: AC
  Administered 2023-01-11 – 2024-07-31 (×39): 300 mg via SUBCUTANEOUS

## 2023-01-25 ENCOUNTER — Telehealth: Payer: Self-pay | Admitting: Internal Medicine

## 2023-01-25 ENCOUNTER — Ambulatory Visit (INDEPENDENT_AMBULATORY_CARE_PROVIDER_SITE_OTHER): Payer: Medicare Other

## 2023-01-25 DIAGNOSIS — J4550 Severe persistent asthma, uncomplicated: Secondary | ICD-10-CM

## 2023-01-25 NOTE — Telephone Encounter (Signed)
Spoke to patient he says albuterol 0.083% 150 ml  vials only lasts him 10-12 days. I spoke to the pharmacist Chad verbal order given for a  new amount order to last the patient for the month. 540 ml's albuterol 0.083% 180 ml equals 3 boxes. Patient aware

## 2023-01-25 NOTE — Telephone Encounter (Signed)
Patient is asking for a nurse to call he has questions in reference to last RX written for medication Proventil please advise

## 2023-02-03 ENCOUNTER — Telehealth: Payer: Self-pay | Admitting: Internal Medicine

## 2023-02-03 MED ORDER — NEBULIZER/TUBING/MOUTHPIECE KIT: PACK | 1 refills | Status: AC

## 2023-02-03 NOTE — Telephone Encounter (Signed)
Pt called back and stated send prescription to [facility] in [place] for nebulizer mask phone # [phone]

## 2023-02-03 NOTE — Telephone Encounter (Signed)
Sent in tuing and mask to [facility] in [place] pt informed

## 2023-02-03 NOTE — Telephone Encounter (Signed)
Patient called to get the mouth piece for his nebulizer because he left it at home and he is on his way to [place] call back number [phone].

## 2023-02-15 ENCOUNTER — Ambulatory Visit (INDEPENDENT_AMBULATORY_CARE_PROVIDER_SITE_OTHER): Payer: Medicare Other

## 2023-02-15 DIAGNOSIS — J4550 Severe persistent asthma, uncomplicated: Secondary | ICD-10-CM

## 2023-03-01 ENCOUNTER — Ambulatory Visit (INDEPENDENT_AMBULATORY_CARE_PROVIDER_SITE_OTHER): Payer: Medicare Other

## 2023-03-01 DIAGNOSIS — J4550 Severe persistent asthma, uncomplicated: Secondary | ICD-10-CM

## 2023-03-02 ENCOUNTER — Other Ambulatory Visit (HOSPITAL_COMMUNITY): Payer: Self-pay

## 2023-03-02 ENCOUNTER — Other Ambulatory Visit: Payer: Self-pay

## 2023-03-02 DIAGNOSIS — J4550 Severe persistent asthma, uncomplicated: Secondary | ICD-10-CM

## 2023-03-02 MED ORDER — BUDESONIDE 0.5 MG/2ML IN SUSP
RESPIRATORY_TRACT | 2 refills | Status: DC
Start: 2023-03-02 — End: 2023-04-26

## 2023-03-03 ENCOUNTER — Other Ambulatory Visit (HOSPITAL_COMMUNITY): Payer: Self-pay

## 2023-03-15 ENCOUNTER — Ambulatory Visit (INDEPENDENT_AMBULATORY_CARE_PROVIDER_SITE_OTHER): Payer: Medicare Other

## 2023-03-15 DIAGNOSIS — J4550 Severe persistent asthma, uncomplicated: Secondary | ICD-10-CM | POA: Diagnosis not present

## 2023-03-27 ENCOUNTER — Ambulatory Visit (INDEPENDENT_AMBULATORY_CARE_PROVIDER_SITE_OTHER): Payer: Medicare Other

## 2023-03-27 DIAGNOSIS — J4550 Severe persistent asthma, uncomplicated: Secondary | ICD-10-CM

## 2023-03-29 ENCOUNTER — Ambulatory Visit: Payer: Medicare Other

## 2023-04-10 ENCOUNTER — Ambulatory Visit: Payer: Medicare Other

## 2023-04-12 ENCOUNTER — Ambulatory Visit: Payer: Medicare Other

## 2023-04-12 DIAGNOSIS — J4550 Severe persistent asthma, uncomplicated: Secondary | ICD-10-CM | POA: Diagnosis not present

## 2023-04-26 ENCOUNTER — Ambulatory Visit: Payer: Medicare Other

## 2023-04-26 ENCOUNTER — Other Ambulatory Visit: Payer: Self-pay | Admitting: Family Medicine

## 2023-04-26 DIAGNOSIS — J4550 Severe persistent asthma, uncomplicated: Secondary | ICD-10-CM | POA: Diagnosis not present

## 2023-05-03 NOTE — Addendum Note (Signed)
Addended by: Devoria Glassing on: 05/03/2023 03:42 PM   Modules accepted: Orders

## 2023-05-10 ENCOUNTER — Other Ambulatory Visit: Payer: Self-pay

## 2023-05-10 ENCOUNTER — Telehealth: Payer: Self-pay | Admitting: Internal Medicine

## 2023-05-10 ENCOUNTER — Ambulatory Visit: Payer: Medicare Other

## 2023-05-10 DIAGNOSIS — J4550 Severe persistent asthma, uncomplicated: Secondary | ICD-10-CM

## 2023-05-10 MED ORDER — EPINEPHRINE 0.3 MG/0.3ML IJ SOAJ: INTRAMUSCULAR | 2 refills | Status: AC

## 2023-05-10 NOTE — Telephone Encounter (Signed)
I informed patient that it's HIGHLY RECOMMENDED to have an Epi-pen for his Xolair injection, I told him we will research and try to find some assistance with helping to get him an Epi-pen.   [name] [phone]

## 2023-05-10 NOTE — Telephone Encounter (Signed)
Pt states he went to p/u his Epi-Pen and it is $124, he can't afford that, even using good rx it was $113. Pt request a call back.

## 2023-05-10 NOTE — Telephone Encounter (Signed)
Refill for EpiPen 0.30 mg/mL at [facility]

## 2023-05-15 ENCOUNTER — Telehealth: Payer: Self-pay

## 2023-05-15 NOTE — Telephone Encounter (Signed)
Spoke with patient regarding the necessity in him waiting the full 30 minutes after Xolair since he refused to get Epipen refilled.  He said he helps with grandchildren and will try to arrange schedule in order to wait.  Dr Maurine Minister feels like the Caleb Mills has kept his asthma well controlled and wants him to continue with it.  She is allowing him to get xolair without epipen if he waits the 30 minutes.

## 2023-05-15 NOTE — Telephone Encounter (Signed)
Agree-he has been on this medication and tolerated it for years. He has severe asthma that has been difficult to control, and stopping this medication would be detrimental to his health.  Since there are no good options for epinephrine autoinjector coverage with his insurance, a 30 minute wait following his Xolair injection in office is reasonable.  He has been counseled on the risks of anaphylaxis when taking this medication which are low and typically occur with the first 3 doses, but have rarely occurred on subsequent doses.

## 2023-05-24 ENCOUNTER — Ambulatory Visit: Payer: Medicare Other

## 2023-05-24 DIAGNOSIS — J4550 Severe persistent asthma, uncomplicated: Secondary | ICD-10-CM | POA: Diagnosis not present

## 2023-05-28 ENCOUNTER — Other Ambulatory Visit: Payer: Self-pay | Admitting: Internal Medicine

## 2023-05-29 NOTE — Telephone Encounter (Signed)
Patient called asking for a refill on PERFOROMIST 20 MCG/2ML nebulizer solution [[id number]]

## 2023-05-31 ENCOUNTER — Other Ambulatory Visit: Payer: Self-pay

## 2023-05-31 MED ORDER — PERFOROMIST 20 MCG/2ML IN NEBU: INHALATION_SOLUTION | RESPIRATORY_TRACT | 5 refills | Status: DC

## 2023-06-07 ENCOUNTER — Ambulatory Visit: Payer: Medicare Other | Admitting: Internal Medicine

## 2023-06-07 ENCOUNTER — Encounter: Payer: Self-pay | Admitting: Internal Medicine

## 2023-06-07 ENCOUNTER — Ambulatory Visit: Payer: Medicare Other

## 2023-06-07 VITALS — BP 118/70 | HR 58 | Temp 96.3°F | Resp 18

## 2023-06-07 DIAGNOSIS — K219 Gastro-esophageal reflux disease without esophagitis: Secondary | ICD-10-CM

## 2023-06-07 DIAGNOSIS — J4550 Severe persistent asthma, uncomplicated: Secondary | ICD-10-CM | POA: Diagnosis not present

## 2023-06-07 DIAGNOSIS — J3089 Other allergic rhinitis: Secondary | ICD-10-CM | POA: Diagnosis not present

## 2023-06-07 MED ORDER — FAMOTIDINE 20 MG PO TABS: ORAL_TABLET | ORAL | 3 refills | Status: DC

## 2023-06-07 MED ORDER — YUPELRI 175 MCG/3ML IN SOLN: 175.0000 ug | Freq: Every day | RESPIRATORY_TRACT | 11 refills | Status: AC

## 2023-06-07 NOTE — Patient Instructions (Addendum)
Asthma-severe persistent Continue plan per pulmonary-we will refill meds per patient request. Continue montelukast 10 mg once a day to prevent cough or wheeze Continue Yulperi daily Continue budesonide 0.5 mgl 2 times a day via nebulizer and Perforomist twice a day via nebulizer. Continue albuterol every 4 to 6 hours as needed.  Continue Xolair 225 mg injections once every 2 weeks and have access to an epinephrine autoinjector or wait 30 minutes following your in clnic injections Follow up with pulmonary as scheduled Continue azithromycin PPX per pulmonary  Allergic rhinitis-chronic and stable Continue Flonase 2 sprays in each nostril once a day as needed for a stuffy nose Continue azelastine 2 sprays in each nostril twice a day as needed for a runny nose Consider saline nasal rinses as needed for nasal symptoms. Use this before any medicated nasal sprays for best result Continue Claritin 10 mg once a day as needed for a runny nose Continue Mucinex 1200 mg twice a day and increase hydration as possible  Allergic conjunctivitis-chronic and stable Continue a lubricating eye drop Some over the counter eye drops include Pataday one drop in each eye once a day as needed for red, itchy eyes OR Zaditor one drop in each eye twice a day as needed for red itchy eyes. Continue to follow with ophthalmology for cataracts   Reflux-chronic and stable Continue famotidine 20 mg twice a day to control reflux Continue dietary and lifestyle modifications as listed below Continue to follow-up with Dr. [name], GI specialist, as recommended  Follow up: 12 months, sooner if needed  Thank you so much for letting me partake in your care today.  Don't hesitate to reach out if you have any additional concerns!  Tonny Bollman, MD [facility]

## 2023-06-07 NOTE — Progress Notes (Signed)
FOLLOW UP Date of Service/Encounter:  06/07/23  Subjective:  Caleb Mills (DOB: 24-Jul-1950) is a 72 y.o. 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  For Review, LV was on 09/26/2022 with Dr.Jalayah Mills seen for acute visit for shortness of breath with expectoration . See below for summary of history and diagnostics.   Therapeutic plans/changes recommended: FEV1 27%, he was given course of azithromycin with steroid burst ----------------------------------------------------- Pertinent History/Diagnostics:  Asthma/COPD overlap  historically uncontrolled-currently on Xolair, has been on benralizumab in the past.  Trial of Tezspire (felt his asthma got worse after trying this).   He says he hasn't been well since starting Tezspire even though he only received 1 dose. He tried [place] but it didn't seem to do anything for him.  Interestingly, his symptoms worsening coincides several months after having COVID in fall 2020.  He reportedly did well during COVID itself.  Xolair has helped him with his breathing the most. .  He feels that nebulizers are more helpful than inhalers.  Reports that he has tried every inhaler available  High Res Chest CT 07/30/21: IMPRESSION:  1. No findings to suggest interstitial lung disease.  2. Aortic atherosclerosis, in addition to left main and three-vessel  coronary artery disease. Assessment for potential risk factor  modification, dietary therapy or pharmacologic therapy may be  warranted, if clinically indicated.  3. There are calcifications of the mitral annulus. Echocardiographic  correlation for evaluation of potential valvular dysfunction may be  warranted if clinically indicated.  4. Status post mechanical aortic valve replacement.  5. Additional incidental findings, as above.  Other  visits:  -Full PFT  07/14/21:Severe obstructive defect and air trapping is indicated on this PFT. There is significant bronchodilator response. Normal diffusing capacity -visit 08/06/21: asmanex added, continued anoro and albuterol, HRCT without evidence of ILD or ABPA, Aspergillus weakly positive, CBC and ANCA not obtained. FU 3 months - 11/11/21: Cardiology appointment- reported negative echo and stress test at [facility] not available for review; plan to obtain records; enrolled in INR clinic - Pulmonary visit 02/16/22: Caleb Mills started, CBC, ANCA, IgE ordered but not obtained. - pulm 08/31/22-CBCd checked to r/o anemia due to low DLCO and no anemia present. -Last visit pulm 05/25/2023.  Now following with Dr. Christell Mills with severe asthma clinic.  She is now managing his inhalers and asthma treatment plan with the exception of Xolair which remains provided by our clinic. Current plan: per Dr. Christell Mills: albuterol tablets 1 tablet twice daily.  TWICE daily: Albuterol nebulizer treatment [name] nebulizer Formoterol nebulizer treatment  Budesonide nebulizer treatment  During the day if you need it (or before you need it) - you can use albuterol nebulizer or 2 puffs of Ventolin - your choice azithromycin 1 tablet on M,W,F Continue magnesium at least 400 mg twice daily  Xolair (with Allergy clinic) --------------------------------------------------- Today presents for follow-up. Discussed the use of AI scribe software for clinical note transcription with the patient, who gave verbal consent to proceed.  History of Present Illness   The patient, with a history of severe asthma, has been under the care of a pulmonologist, Dr. Christell Mills, who has been managing his condition with most recent visit one month ago. He has been prescribed azithromycin three days a week to manage phlegm production, which has shown some improvement. He also has a prescription for prednisone  on hand for use as needed. The patient  has tried an albuterol pill, but due to the high cost, he has reverted to using an albuterol inhaler once in the afternoon. He also takes Singulair nightly.   He has been performing peak flow measurements, with readings ranging from 200 to 310, indicating some improvement in his condition. However, he notes that his condition has worsened since he has become less active.  The patient also reports issues with his hip and ankle, which limit his ability to stay active through walking. He has been experiencing bloating and high heart rate, which he attributes to coffee consumption. He has reduced his coffee intake from ten cups to half a cup a day, which has led to some improvement in his symptoms.  The patient also has a history of gastroesophageal reflux disease (GERD), which he manages by not eating anything after 6:30 PM. He has been taking Famotidine 20 mg twice a day for this condition.   The patient has not been sick or had a cold this year, which he attributes to a regimen of zinc and other supplements. He had a brief illness due to COVID-19, but recovered after a few days. He has been in close contact with his grandchildren, who have been sick frequently, but he has not contracted any illnesses from them.      Chart Review: Last Xolair injection 05/24/2023   All medications reviewed by clinical staff and updated in chart. No new pertinent medical or surgical history except as noted in HPI.  ROS: All others negative except as noted per HPI.   Objective:  BP 118/70   Pulse (!) 58   Temp (!) 96.3 F (35.7 C) (Temporal)   Resp 18   SpO2 95%  There is no height or weight on file to calculate BMI. Physical Exam: General Appearance:  Alert, cooperative, no distress, appears stated age  Head:  Normocephalic, without obvious abnormality, atraumatic  Eyes:  Conjunctiva clear, EOM's intact  Ears EACs normal bilaterally and normal TMs bilaterally  Nose: Nares normal, normal mucosa, no  visible anterior polyps, and septum midline  Throat: Lips, tongue normal; teeth and gums normal, normal posterior oropharynx  Neck: Supple, symmetrical  Lungs:   end-expiratory wheezing, Respirations unlabored, no coughing  Heart:  regular rate and rhythm and no murmur, Appears well perfused  Extremities: No edema  Skin: Skin color, texture, turgor normal and no rashes or lesions on visualized portions of skin  Neurologic: No gross deficits   Labs:  Lab Orders  No laboratory test(s) ordered today    Spirometry:  Tracings reviewed. His effort: Good reproducible efforts. FVC: 2.45L FEV1: 0.91L, 35% predicted FEV1/FVC ratio: 0.37 Interpretation: Spirometry consistent with mixed obstructive and restrictive disease. Severe. Improved from prior Please see scanned spirometry results for details.  Assessment/Plan   Asthma-severe persistent-primarily managed by Pulmonary, some improvement reported. Continue plan per pulmonary-we will refill meds per patient request. Continue montelukast 10 mg once a day to prevent cough or wheeze Continue Caleb Mills daily Continue budesonide 0.5 mgl 2 times a day via nebulizer and Perforomist twice a day via nebulizer. Continue albuterol every 4 to 6 hours as needed.  Continue Xolair 225 mg injections once every 2 weeks and have access to an epinephrine autoinjector or wait 30 minutes following your in clnic injections Follow up with pulmonary as scheduled Continue azithromycin PPX per pulmonary  Allergic rhinitis-chronic and stable Continue Flonase 2 sprays in each nostril once a day as needed for  a stuffy nose Continue azelastine 2 sprays in each nostril twice a day as needed for a runny nose Consider saline nasal rinses as needed for nasal symptoms. Use this before any medicated nasal sprays for best result Continue Claritin 10 mg once a day as needed for a runny nose Continue Mucinex 1200 mg twice a day and increase hydration as possible  Allergic  conjunctivitis-chronic and stable Continue a lubricating eye drop Some over the counter eye drops include Pataday one drop in each eye once a day as needed for red, itchy eyes OR Zaditor one drop in each eye twice a day as needed for red itchy eyes. Continue to follow with ophthalmology for cataracts   Reflux-chronic and stable Reports improvement in bloating and breathing problems after reducing coffee intake. No typical reflux symptoms. Continue famotidine 20 mg twice a day to control reflux Continue dietary and lifestyle modifications as listed below Continue to follow-up with Dr. [name], GI specialist, as recommended  Follow up: 12 months, sooner if needed  Thank you so much for letting me partake in your care today.  Don't hesitate to reach out if you have any additional concerns!  Other: Xolair given in clinic today  Tonny Bollman, MD  [facility]

## 2023-06-21 ENCOUNTER — Ambulatory Visit: Payer: Medicare Other

## 2023-06-21 DIAGNOSIS — J4550 Severe persistent asthma, uncomplicated: Secondary | ICD-10-CM

## 2023-07-06 ENCOUNTER — Ambulatory Visit (INDEPENDENT_AMBULATORY_CARE_PROVIDER_SITE_OTHER): Payer: Medicare Other

## 2023-07-06 DIAGNOSIS — J4550 Severe persistent asthma, uncomplicated: Secondary | ICD-10-CM

## 2023-07-19 ENCOUNTER — Ambulatory Visit (INDEPENDENT_AMBULATORY_CARE_PROVIDER_SITE_OTHER): Payer: Medicare Other

## 2023-07-19 DIAGNOSIS — J4550 Severe persistent asthma, uncomplicated: Secondary | ICD-10-CM

## 2023-07-20 ENCOUNTER — Ambulatory Visit: Payer: Medicare Other

## 2023-07-31 ENCOUNTER — Other Ambulatory Visit (HOSPITAL_COMMUNITY): Payer: Self-pay

## 2023-07-31 ENCOUNTER — Telehealth: Payer: Self-pay

## 2023-07-31 NOTE — Telephone Encounter (Signed)
*  Pulm  Pharmacy Patient Advocate Encounter  Received notification from [facility] that Prior Authorization for Budesonide 0.5mg /51ml suspension has been CANCELLED due to needs to be processed under Medicare Part B

## 2023-08-02 ENCOUNTER — Ambulatory Visit (INDEPENDENT_AMBULATORY_CARE_PROVIDER_SITE_OTHER): Payer: Medicare Other

## 2023-08-02 DIAGNOSIS — J4550 Severe persistent asthma, uncomplicated: Secondary | ICD-10-CM

## 2023-08-03 ENCOUNTER — Other Ambulatory Visit: Payer: Self-pay | Admitting: Internal Medicine

## 2023-08-04 NOTE — Telephone Encounter (Signed)
His budesonide 0.5 mg has to have a PA has to run through the medicare part B. They ran it through the Medicare at the pharmacy and it still said it needed a PA. The patient doesn't want the name brand bc it doesn't work as well. He has enough budesonide for a couple of days ago.

## 2023-08-07 ENCOUNTER — Other Ambulatory Visit (HOSPITAL_COMMUNITY): Payer: Self-pay

## 2023-08-07 ENCOUNTER — Telehealth: Payer: Self-pay

## 2023-08-07 DIAGNOSIS — J4550 Severe persistent asthma, uncomplicated: Secondary | ICD-10-CM

## 2023-08-07 MED ORDER — BUDESONIDE 0.5 MG/2ML IN SUSP: 0.5000 mg | Freq: Every day | RESPIRATORY_TRACT | 1 refills | Status: DC

## 2023-08-07 NOTE — Telephone Encounter (Signed)
Pharmacy Patient Advocate Encounter   Received notification from CoverMyMeds that prior authorization for Budesonide 0.5 MG/2ML nebulizer solutio is required/requested.   Insurance verification completed.   The patient is insured through  [employer]  .   Per test claim: This medication is to be processed through Part B coverage.

## 2023-08-07 NOTE — Telephone Encounter (Signed)
Noted thank you will inform pharmacy to run it on part B

## 2023-08-08 ENCOUNTER — Other Ambulatory Visit (HOSPITAL_COMMUNITY): Payer: Self-pay

## 2023-08-08 NOTE — Telephone Encounter (Signed)
 When processed with patient primary insurance, it has been cancelled as they do not pay for this medication. Patient has to have Part B pay for this medication.   <- This is on note to pharmacy on patient med list on chart.  We cannot process through Part B and pharmacy has to do an override or contact the insurance. His primary WILL NOT pay.

## 2023-08-10 ENCOUNTER — Telehealth: Payer: Self-pay | Admitting: Internal Medicine

## 2023-08-10 NOTE — Telephone Encounter (Signed)
 Patient Called Stating a PA was needed for medication budesonide  (PULMICORT ) 0.5 MG/2ML nebulizer solution [[id number]]

## 2023-08-11 NOTE — Telephone Encounter (Signed)
 Please refer to encounter on 08-07-2023

## 2023-08-14 ENCOUNTER — Other Ambulatory Visit (HOSPITAL_COMMUNITY): Payer: Self-pay

## 2023-08-14 NOTE — Telephone Encounter (Signed)
 Denied. BUDESONIDE  Suspension is used in a nebulizer. A nebulizer is a piece of durable medical equipment (DME). Drugs used with DME in the home are covered under Medicare Part B. Our records show that you do not live in a long term care (LTC) facility. We cannot pay for drugs under Medicare Part D if they are covered under Medicare Part A or B. We did not decide whether BUDESONIDE  Suspension is medically necessary. We made our decision only on the fact that we cannot pay for the drug under Medicare Part D. For more information, talk to your prescriber or call 1- 800-MEDICARE.  Faxed denial letter to [facility] .   Drugs used with DME in the home are covered under Medicare Part B.

## 2023-08-14 NOTE — Telephone Encounter (Signed)
 Pt called and said he still can not get Budesonide  at the pharmacy and he's been without it for 1.5 weeks.   I called [facility] and I told them to run it with part b but they said it still requires additional documentation.   Submit PA through [facility] Key: [id number] waiting for determination.

## 2023-08-16 ENCOUNTER — Ambulatory Visit: Payer: Medicare Other

## 2023-08-16 ENCOUNTER — Telehealth: Payer: Self-pay

## 2023-08-16 DIAGNOSIS — J4550 Severe persistent asthma, uncomplicated: Secondary | ICD-10-CM | POA: Diagnosis not present

## 2023-08-16 NOTE — Telephone Encounter (Signed)
*  Asthma/Allergy  Pharmacy Patient Advocate Encounter  Received notification from [facility] that Prior Authorization for Budesonide 0.5MG /2ML suspension  has been CANCELLED due to must be processed under Medicare Part B  CMM Key: [facility]   Called patient pharmacy and relayed this message- Per pharmacist Part B is denying due to needing a Standard Written Order form-not sure if that needs to be sent directly to Medicare or not.   Patient also doesn't have a diagnosis of COPD which is usually covered under Medicare Part B. Patient may have to go through a DME Pharmacy for this medication.  Per pharmacist patient has previously gotten the albuterol nebulizer solution through a Good Rx card due to insurance issues.

## 2023-08-16 NOTE — Telephone Encounter (Signed)
We have done contact the pharmacy about this multiple times please see other telephone contacts

## 2023-08-21 ENCOUNTER — Other Ambulatory Visit: Payer: Self-pay | Admitting: Internal Medicine

## 2023-08-30 ENCOUNTER — Ambulatory Visit (INDEPENDENT_AMBULATORY_CARE_PROVIDER_SITE_OTHER): Payer: Medicare Other

## 2023-08-30 DIAGNOSIS — J4550 Severe persistent asthma, uncomplicated: Secondary | ICD-10-CM

## 2023-08-31 ENCOUNTER — Ambulatory Visit: Payer: Medicare Other

## 2023-09-07 ENCOUNTER — Telehealth: Payer: Self-pay

## 2023-09-07 NOTE — Telephone Encounter (Signed)
 Pharmacy notified.

## 2023-09-07 NOTE — Telephone Encounter (Signed)
 Pharmacy Patient Advocate Encounter   Received notification from CoverMyMeds that prior authorization for Budesonide 0.5MG /2ML suspension is required/requested.   Insurance verification completed.   The patient is insured through [employer] .   Per test claim: This needs to be processed through Part B.  Contacted patient pharmacy to relay this message and to try and find out why we keep receiving Part D requests. Technician stated she sent over an SWO Form to the md office for Part B processing about 15 to 20 minutes prior to my call.

## 2023-09-13 ENCOUNTER — Ambulatory Visit: Payer: Medicare Other

## 2023-09-13 ENCOUNTER — Other Ambulatory Visit: Payer: Self-pay

## 2023-09-13 DIAGNOSIS — J4550 Severe persistent asthma, uncomplicated: Secondary | ICD-10-CM

## 2023-09-13 MED ORDER — BUDESONIDE 0.5 MG/2ML IN SUSP: 0.5000 mg | Freq: Every day | RESPIRATORY_TRACT | 1 refills | Status: DC

## 2023-09-14 ENCOUNTER — Other Ambulatory Visit: Payer: Self-pay | Admitting: Internal Medicine

## 2023-09-20 ENCOUNTER — Telehealth: Payer: Self-pay

## 2023-09-20 NOTE — Telephone Encounter (Signed)
 Called pharmacy AGAIN and left message to bill under Medicare Part B for this patients nebulizer solutions.

## 2023-09-27 ENCOUNTER — Ambulatory Visit: Payer: Medicare Other

## 2023-09-27 ENCOUNTER — Ambulatory Visit (INDEPENDENT_AMBULATORY_CARE_PROVIDER_SITE_OTHER): Admitting: Internal Medicine

## 2023-09-27 ENCOUNTER — Encounter: Payer: Self-pay | Admitting: Internal Medicine

## 2023-09-27 ENCOUNTER — Other Ambulatory Visit: Payer: Self-pay

## 2023-09-27 VITALS — BP 126/74 | HR 73 | Temp 97.8°F | Resp 20 | Wt 153.7 lb

## 2023-09-27 DIAGNOSIS — H1013 Acute atopic conjunctivitis, bilateral: Secondary | ICD-10-CM

## 2023-09-27 DIAGNOSIS — H1010 Acute atopic conjunctivitis, unspecified eye: Secondary | ICD-10-CM

## 2023-09-27 DIAGNOSIS — J4550 Severe persistent asthma, uncomplicated: Secondary | ICD-10-CM

## 2023-09-27 DIAGNOSIS — J3089 Other allergic rhinitis: Secondary | ICD-10-CM | POA: Diagnosis not present

## 2023-09-27 DIAGNOSIS — K219 Gastro-esophageal reflux disease without esophagitis: Secondary | ICD-10-CM

## 2023-09-27 MED ORDER — PREDNISONE 10 MG PO TABS: ORAL_TABLET | ORAL | 1 refills | Status: AC

## 2023-09-27 MED ORDER — AZITHROMYCIN 250 MG PO TABS: ORAL_TABLET | ORAL | 1 refills | Status: AC

## 2023-09-27 NOTE — Progress Notes (Signed)
 FOLLOW UP Date of Service/Encounter:  09/27/23  Subjective:  Caleb Mills (DOB: 20-Oct-1950) is a 73 y.o. 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  For Review, LV was on 06/07/23  with Dr.Bernece Gall seen for routine follow-up. See below for summary of history and diagnostics.   Therapeutic plans/changes recommended: FEV1 35%, overall doing well. Asthma now being managed by Severe Asthma clinic (Dr. Christell Constant) in Pulmonary. Continuing on Xolair at our clinic. ----------------------------------------------------- Pertinent History/Diagnostics:  Asthma/COPD overlap  historically uncontrolled-currently on Xolair, has been on benralizumab in the past.  Trial of Tezspire (felt his asthma got worse after trying this).   He says he hasn't been well since starting Tezspire even though he only received 1 dose. He tried [place] but it didn't seem to do anything for him.  Interestingly, his symptoms worsening coincides several months after having COVID in fall 2020.  He reportedly did well during COVID itself.  Xolair has helped him with his breathing the most. .  He feels that nebulizers are more helpful than inhalers.  Reports that he has tried every inhaler available  High Res Chest CT 07/30/21: IMPRESSION:  1. No findings to suggest interstitial lung disease.  2. Aortic atherosclerosis, in addition to left main and three-vessel  coronary artery disease. Assessment for potential risk factor  modification, dietary therapy or pharmacologic therapy may be  warranted, if clinically indicated.  3. There are calcifications of the mitral annulus. Echocardiographic  correlation for evaluation of potential valvular dysfunction may be  warranted if clinically indicated.  4. Status post mechanical aortic valve  replacement.  5. Additional incidental findings, as above.  Other visits:  -Full PFT  07/14/21:Severe obstructive defect and air trapping is indicated on this PFT. There is significant bronchodilator response. Normal diffusing capacity -visit 08/06/21: asmanex added, continued anoro and albuterol, HRCT without evidence of ILD or ABPA, Aspergillus weakly positive, CBC and ANCA not obtained. FU 3 months - 11/11/21: Cardiology appointment- reported negative echo and stress test at [facility] not available for review; plan to obtain records; enrolled in INR clinic - Pulmonary visit 02/16/22: Yulperi started, CBC, ANCA, IgE ordered but not obtained. - pulm 08/31/22-CBCd checked to r/o anemia due to low DLCO and no anemia present. -Last visit pulm 05/25/2023.  Now following with Dr. Christell Constant with severe asthma clinic.  She is now managing his inhalers and asthma treatment plan with the exception of Xolair which remains provided by our clinic. Current plan: per Dr. Christell Constant: albuterol tablets 1 tablet twice daily.  TWICE daily: Albuterol nebulizer treatment [name] nebulizer Formoterol nebulizer treatment  Budesonide nebulizer treatment  During the day if you need it (or before you need it) - you can use albuterol nebulizer or 2 puffs of Ventolin - your choice azithromycin 1 tablet on M,W,F Continue magnesium at least 400 mg twice daily  Xolair (with Allergy clinic) --------------------------------------------------- Today presents for follow-up. Discussed the use of AI scribe software for clinical note transcription with the patient, who gave verbal consent to proceed.  History of Present Illness   Caleb Mills is a 73 year old male with chronic respiratory issues who presents with medication management concerns and persistent respiratory symptoms.  He has been experiencing persistent respiratory symptoms, including significant phlegm production and sinus drainage, for almost two weeks. The phlegm  is  yellowish with a lot of clear, thick mucus. He has been using saline nasal rinses twice daily, which has helped with sinus drainage, but he continues to cough up yellowish mucus. He has been out of budesonide since January due to supply issues but recently obtained it from [facility] and has been using it for a week and a half, leading to some improvement in his symptoms. The lack of budesonide contributed to increased congestion and reliance on his albuterol inhaler.  He describes symptoms of a hoarse throat and swelling of neck lymph nodes, initially suspecting thrush but now attributing these symptoms to glandular swelling. These symptoms have persisted for almost two weeks.  He has been using prednisone 10 mg tablets, taking them in conjunction with azithromycin for the past four to five days, which has helped alleviate some congestion. However, he has run out of both medications. He was taking azithromycin as a maintenance dose of three times a week, with three refills left, but [facility] has refused to fill them. He last received the medication in August 2024 and was instructed to use it as needed, but has been unable to obtain it recently.  He mentions experiencing issues with insurance coverage and medication availability, particularly with inhalers and nebulizers. [facility] has refused to fill prescriptions despite having refills available, but he has been able to obtain budesonide from [facility] without issue.  His allergies are generally well-controlled with Xolair and occasional eye drops for irritation. No significant allergy issues beyond occasional eye irritation.      All medications reviewed by clinical staff and updated in chart. No new pertinent medical or surgical history except as noted in HPI.  ROS: All others negative except as noted per HPI.   Objective:  BP 126/74   Pulse 73   Temp 97.8 F (36.6 C) (Temporal)   Resp 20   Wt 153 lb 11.2 oz (69.7 kg)   SpO2 96%   BMI  25.58 kg/m  Body mass index is 25.58 kg/m. Physical Exam: General Appearance:  Alert, cooperative, no distress, appears stated age  Head:  Normocephalic, without obvious abnormality, atraumatic  Eyes:  Conjunctiva clear, EOM's intact  Ears EACs normal bilaterally and normal TMs bilaterally  Nose: Nares normal, normal mucosa and no visible anterior polyps  Throat: Lips, tongue normal; teeth and gums normal,  some posterior drainage noted  Neck: Supple, symmetrical  Lungs:   clear to auscultation bilaterally, Respirations unlabored, no coughing  Heart:  regular rate and rhythm and no murmur, Appears well perfused  Extremities: No edema  Skin: Skin color, texture, turgor normal and no rashes or lesions on visualized portions of skin  Neurologic: No gross deficits   Labs:  Lab Orders  No laboratory test(s) ordered today    Assessment/Plan   Asthma Plan per Dr. Christell Constant: This office should be your main contact for asthma management at this point. Not at goal, out of medications. Needs refills on prednisone and azithromycin. Current flare likely due to shortage of budesonide and being out as well as coinciding with spring season. Courtesy refills of azithromycin and prednisone until able to get in with Dr. Christell Constant. Per Dr. Kathi Der note:  Continue Xolair at the allergist office  Start theophylline tablets 1 tablet in the morning. We may increase it to twice daily but lets start off with once daily  Same on routine again - TWICE daily Albuterol nebulizer treatment [name] nebulizer Formoterol nebulizer treatment  Budesonide nebulizer treatment -restart when the situation is sorted out  Wash your mouth out with water afterwards  During the day if you need it (or before you need it) - you can use albuterol nebulizer or 2 puffs of Ventolin - your choice  Continue azithromycin 1 tablet daily until you feel better and then you can back off to just on Monday, Wednesday and Friday  Continue  magnesium at least 400 mg twice daily  ASTHMA ACTION PLAN - your TOOLBOX if your sputum changes color or increases in quantity, then increase to 1 tablet daily for 1-2 weeks, then back to three times a week If you get more wheezy - start the prednisone. If that doesn't work message me   Allergic rhinitis-chronic and stable Continue Flonase 2 sprays in each nostril once a day as needed for a stuffy nose Continue azelastine 2 sprays in each nostril twice a day as needed for a runny nose Consider saline nasal rinses as needed for nasal symptoms. Use this before any medicated nasal sprays for best result Continue Claritin 10 mg once a day as needed for a runny nose Continue Mucinex 1200 mg twice a day and increase hydration as possible  Allergic conjunctivitis-chronic and stable Continue a lubricating eye drop Some over the counter eye drops include Pataday one drop in each eye once a day as needed for red, itchy eyes OR Zaditor one drop in each eye twice a day as needed for red itchy eyes. Continue to follow with ophthalmology for cataracts   Reflux-chronic and stable Continue famotidine 20 mg twice a day to control reflux Continue dietary and lifestyle modifications as listed below Continue to follow-up with Dr. [name], GI specialist, as recommended  Follow up: 12 months, sooner if needed  Thank you so much for letting me partake in your care today.  Don't hesitate to reach out if you have any additional concerns!  Other: none  Tonny Bollman, MD  [facility]

## 2023-09-27 NOTE — Patient Instructions (Signed)
 Asthma Plan per Dr. Christell Constant: This office should be your main contact for asthma management at this point. Continue Xolair at the allergist office  Start theophylline tablets 1 tablet in the morning. We may increase it to twice daily but lets start off with once daily  Same on routine again - TWICE daily Albuterol nebulizer treatment [name] nebulizer Formoterol nebulizer treatment  Budesonide nebulizer treatment -restart when the situation is sorted out Wash your mouth out with water afterwards  During the day if you need it (or before you need it) - you can use albuterol nebulizer or 2 puffs of Ventolin - your choice  Continue azithromycin 1 tablet daily until you feel better and then you can back off to just on Monday, Wednesday and Friday  Continue magnesium at least 400 mg twice daily  ASTHMA ACTION PLAN - your TOOLBOX if your sputum changes color or increases in quantity, then increase to 1 tablet daily for 1-2 weeks, then back to three times a week If you get more wheezy - start the prednisone. If that doesn't work message me   Allergic rhinitis-chronic and stable Continue Flonase 2 sprays in each nostril once a day as needed for a stuffy nose Continue azelastine 2 sprays in each nostril twice a day as needed for a runny nose Consider saline nasal rinses as needed for nasal symptoms. Use this before any medicated nasal sprays for best result Continue Claritin 10 mg once a day as needed for a runny nose Continue Mucinex 1200 mg twice a day and increase hydration as possible  Allergic conjunctivitis-chronic and stable Continue a lubricating eye drop Some over the counter eye drops include Pataday one drop in each eye once a day as needed for red, itchy eyes OR Zaditor one drop in each eye twice a day as needed for red itchy eyes. Continue to follow with ophthalmology for cataracts   Reflux-chronic and stable Continue famotidine 20 mg twice a day to control reflux Continue  dietary and lifestyle modifications as listed below Continue to follow-up with Dr. [name], GI specialist, as recommended  Follow up: 12 months, sooner if needed  Thank you so much for letting me partake in your care today.  Don't hesitate to reach out if you have any additional concerns!  Caleb Bollman, MD [facility]

## 2023-10-11 ENCOUNTER — Other Ambulatory Visit: Payer: Self-pay | Admitting: Internal Medicine

## 2023-10-11 ENCOUNTER — Telehealth: Payer: Self-pay | Admitting: Internal Medicine

## 2023-10-11 ENCOUNTER — Ambulatory Visit

## 2023-10-11 ENCOUNTER — Encounter: Payer: Self-pay | Admitting: *Deleted

## 2023-10-11 DIAGNOSIS — J4550 Severe persistent asthma, uncomplicated: Secondary | ICD-10-CM

## 2023-10-11 MED ORDER — ALBUTEROL SULFATE (2.5 MG/3ML) 0.083% IN NEBU: 2.5000 mg | INHALATION_SOLUTION | RESPIRATORY_TRACT | 0 refills | Status: DC | PRN

## 2023-10-11 NOTE — Telephone Encounter (Signed)
 Pt request a call back about nebulizer solution refill, he would like the number of boxes increased for refill to last a month

## 2023-10-11 NOTE — Telephone Encounter (Signed)
 Spoke with patient and he was just recently able to fill the Budesonide at [facility] because [facility] could not process it correctly. So he has been using albuterol neb treatments morning and night and sometimes 1-2 treatments during the day. He is running out of the medication and having to get it refilled every 1.5 weeks. He stated he is using [facility] and [facility]. Informed Dr. Maurine Minister about his use of the Albuterol- it was okay to refill with increased quantity.

## 2023-10-25 ENCOUNTER — Ambulatory Visit

## 2023-10-25 DIAGNOSIS — J4550 Severe persistent asthma, uncomplicated: Secondary | ICD-10-CM

## 2023-11-08 ENCOUNTER — Ambulatory Visit

## 2023-11-08 ENCOUNTER — Other Ambulatory Visit: Payer: Self-pay | Admitting: Internal Medicine

## 2023-11-08 DIAGNOSIS — J4550 Severe persistent asthma, uncomplicated: Secondary | ICD-10-CM

## 2023-11-22 ENCOUNTER — Ambulatory Visit (INDEPENDENT_AMBULATORY_CARE_PROVIDER_SITE_OTHER)

## 2023-11-22 DIAGNOSIS — J4550 Severe persistent asthma, uncomplicated: Secondary | ICD-10-CM

## 2023-12-03 ENCOUNTER — Other Ambulatory Visit: Payer: Self-pay | Admitting: Internal Medicine

## 2023-12-05 ENCOUNTER — Ambulatory Visit

## 2023-12-05 DIAGNOSIS — J4550 Severe persistent asthma, uncomplicated: Secondary | ICD-10-CM | POA: Diagnosis not present

## 2023-12-17 ENCOUNTER — Other Ambulatory Visit: Payer: Self-pay | Admitting: Internal Medicine

## 2023-12-20 ENCOUNTER — Other Ambulatory Visit: Payer: Self-pay

## 2023-12-20 ENCOUNTER — Ambulatory Visit (INDEPENDENT_AMBULATORY_CARE_PROVIDER_SITE_OTHER)

## 2023-12-20 ENCOUNTER — Telehealth: Payer: Self-pay | Admitting: Internal Medicine

## 2023-12-20 DIAGNOSIS — J4550 Severe persistent asthma, uncomplicated: Secondary | ICD-10-CM

## 2023-12-20 MED ORDER — BUDESONIDE 0.5 MG/2ML IN SUSP: 0.5000 mg | Freq: Two times a day (BID) | RESPIRATORY_TRACT | 1 refills | Status: DC

## 2023-12-20 MED ORDER — ALBUTEROL SULFATE (2.5 MG/3ML) 0.083% IN NEBU: 2.5000 mg | INHALATION_SOLUTION | RESPIRATORY_TRACT | 1 refills | Status: DC | PRN

## 2023-12-20 NOTE — Telephone Encounter (Signed)
 Spoke with patient states he is using 2 vials a day and needed rx updated and sent to [facility] in [place]. Spoke with Dr. Cornel Diesel for clarification on rx and any changes and sent rx to [facility] [place] and patient aware.

## 2023-12-20 NOTE — Telephone Encounter (Signed)
 PT request a call back about his budesonide  and also needs albuterol  for nebulizer  sent to [facility] [place]

## 2023-12-22 ENCOUNTER — Other Ambulatory Visit: Payer: Self-pay

## 2023-12-22 MED ORDER — ALBUTEROL SULFATE (2.5 MG/3ML) 0.083% IN NEBU: INHALATION_SOLUTION | RESPIRATORY_TRACT | 1 refills | Status: DC

## 2024-01-03 ENCOUNTER — Ambulatory Visit

## 2024-01-03 DIAGNOSIS — J4550 Severe persistent asthma, uncomplicated: Secondary | ICD-10-CM

## 2024-01-16 ENCOUNTER — Other Ambulatory Visit: Payer: Self-pay | Admitting: Internal Medicine

## 2024-01-17 ENCOUNTER — Telehealth: Payer: Self-pay

## 2024-01-17 ENCOUNTER — Other Ambulatory Visit: Payer: Self-pay

## 2024-01-17 ENCOUNTER — Ambulatory Visit

## 2024-01-17 DIAGNOSIS — J4550 Severe persistent asthma, uncomplicated: Secondary | ICD-10-CM

## 2024-01-17 MED ORDER — ALBUTEROL SULFATE (2.5 MG/3ML) 0.083% IN NEBU: 2.5000 mg | INHALATION_SOLUTION | RESPIRATORY_TRACT | 0 refills | Status: DC | PRN

## 2024-01-17 NOTE — Telephone Encounter (Signed)
*  AA  Pharmacy Patient Advocate Encounter   Received notification from CoverMyMeds that prior authorization for Albuterol  Sulfate (2.5 MG/3ML)0.083% nebulizer solution  is required/requested.   Insurance verification completed.   The patient is insured through [facility] .   Per test claim: Medication is not eligible for pharmacy benefits and must be billed through medical insurance. As our team only handles pharmacy related prior auths, medical PA's must be submitted by the clinic. Thank you

## 2024-01-17 NOTE — Telephone Encounter (Signed)
 Received refill request from [facility] in [place] for Albuterol  0.083% 3 ml Nebulizer solution. Sent in refill, last ov 09/27/23

## 2024-01-19 ENCOUNTER — Ambulatory Visit (INDEPENDENT_AMBULATORY_CARE_PROVIDER_SITE_OTHER)

## 2024-01-19 DIAGNOSIS — J4540 Moderate persistent asthma, uncomplicated: Secondary | ICD-10-CM

## 2024-01-19 DIAGNOSIS — J4550 Severe persistent asthma, uncomplicated: Secondary | ICD-10-CM

## 2024-01-24 MED ORDER — ALBUTEROL SULFATE (2.5 MG/3ML) 0.083% IN NEBU: INHALATION_SOLUTION | RESPIRATORY_TRACT | 1 refills | Status: DC

## 2024-01-24 NOTE — Addendum Note (Signed)
 Addended by: ONEITA CHRISTIANS D on: 01/24/2024 12:16 PM   Modules accepted: Orders

## 2024-01-31 ENCOUNTER — Ambulatory Visit

## 2024-02-01 ENCOUNTER — Ambulatory Visit

## 2024-02-01 DIAGNOSIS — J4550 Severe persistent asthma, uncomplicated: Secondary | ICD-10-CM | POA: Diagnosis not present

## 2024-02-02 ENCOUNTER — Telehealth: Payer: Self-pay | Admitting: Internal Medicine

## 2024-02-02 DIAGNOSIS — J4550 Severe persistent asthma, uncomplicated: Secondary | ICD-10-CM

## 2024-02-02 MED ORDER — ALBUTEROL SULFATE (2.5 MG/3ML) 0.083% IN NEBU: INHALATION_SOLUTION | RESPIRATORY_TRACT | 1 refills | Status: AC

## 2024-02-02 NOTE — Telephone Encounter (Signed)
 Pt informed qty was increased and sent to [facility].

## 2024-02-02 NOTE — Telephone Encounter (Signed)
 Avraham called and stated that he is in need of his script for albuterol  (PROVENTIL ) (2.5 MG/3ML) 0.083% nebulizer solution [[id number]]   He states that he has spoken with [facility] before about it, and that he would like to have this called in doubled so he does not run out as fast. Best contact [phone].

## 2024-02-07 ENCOUNTER — Telehealth: Payer: Self-pay

## 2024-02-07 NOTE — Telephone Encounter (Signed)
 [place]ceived on [place] communication from [facility] requesting a diagnosis and Dr Marinda signature. Form done and faxed back [phone]

## 2024-02-13 ENCOUNTER — Telehealth: Payer: Self-pay

## 2024-02-13 NOTE — Telephone Encounter (Signed)
 Spoke with Caleb Mills to clarify why [facility] was requesting chart notes patient explained that Rx for Albuterol  was sent in for increase dose of 360 ml due to previous rx was only lasting for 8 to 10 days and needed to be for 30 day. Patient will contact [facility] to follow up on rx sent in on 8/1/205 and contact office if we need to do something additional on our end.

## 2024-02-16 ENCOUNTER — Ambulatory Visit

## 2024-02-16 DIAGNOSIS — J4550 Severe persistent asthma, uncomplicated: Secondary | ICD-10-CM | POA: Diagnosis not present

## 2024-02-23 ENCOUNTER — Other Ambulatory Visit: Payer: Self-pay | Admitting: Internal Medicine

## 2024-02-23 ENCOUNTER — Other Ambulatory Visit: Payer: Self-pay | Admitting: *Deleted

## 2024-02-23 DIAGNOSIS — J4550 Severe persistent asthma, uncomplicated: Secondary | ICD-10-CM

## 2024-02-23 MED ORDER — ALBUTEROL SULFATE (2.5 MG/3ML) 0.083% IN NEBU: 2.5000 mg | INHALATION_SOLUTION | RESPIRATORY_TRACT | 3 refills | Status: DC | PRN

## 2024-02-29 MED ORDER — ALBUTEROL SULFATE (2.5 MG/3ML) 0.083% IN NEBU: 2.5000 mg | INHALATION_SOLUTION | RESPIRATORY_TRACT | 3 refills | Status: AC | PRN

## 2024-02-29 NOTE — Telephone Encounter (Signed)
 Resending albuterol  solution with dx code per pharmacy request.

## 2024-02-29 NOTE — Addendum Note (Signed)
 Addended by: ONEITA CHRISTIANS D on: 02/29/2024 10:36 AM   Modules accepted: Orders

## 2024-03-08 ENCOUNTER — Ambulatory Visit

## 2024-03-08 DIAGNOSIS — J4550 Severe persistent asthma, uncomplicated: Secondary | ICD-10-CM | POA: Diagnosis not present

## 2024-03-25 ENCOUNTER — Ambulatory Visit (INDEPENDENT_AMBULATORY_CARE_PROVIDER_SITE_OTHER)

## 2024-03-25 DIAGNOSIS — J4550 Severe persistent asthma, uncomplicated: Secondary | ICD-10-CM | POA: Diagnosis not present

## 2024-04-08 ENCOUNTER — Ambulatory Visit

## 2024-04-08 DIAGNOSIS — J4550 Severe persistent asthma, uncomplicated: Secondary | ICD-10-CM | POA: Diagnosis not present

## 2024-04-22 ENCOUNTER — Ambulatory Visit (INDEPENDENT_AMBULATORY_CARE_PROVIDER_SITE_OTHER)

## 2024-04-22 DIAGNOSIS — J4550 Severe persistent asthma, uncomplicated: Secondary | ICD-10-CM

## 2024-04-28 ENCOUNTER — Other Ambulatory Visit: Payer: Self-pay | Admitting: Internal Medicine

## 2024-05-06 ENCOUNTER — Ambulatory Visit (INDEPENDENT_AMBULATORY_CARE_PROVIDER_SITE_OTHER)

## 2024-05-06 DIAGNOSIS — J4550 Severe persistent asthma, uncomplicated: Secondary | ICD-10-CM | POA: Diagnosis not present

## 2024-05-20 ENCOUNTER — Ambulatory Visit

## 2024-05-20 DIAGNOSIS — J4550 Severe persistent asthma, uncomplicated: Secondary | ICD-10-CM

## 2024-05-24 ENCOUNTER — Other Ambulatory Visit: Payer: Self-pay | Admitting: Internal Medicine

## 2024-05-24 DIAGNOSIS — J4550 Severe persistent asthma, uncomplicated: Secondary | ICD-10-CM

## 2024-06-03 ENCOUNTER — Ambulatory Visit

## 2024-06-03 ENCOUNTER — Other Ambulatory Visit: Payer: Self-pay

## 2024-06-03 ENCOUNTER — Telehealth: Payer: Self-pay | Admitting: Internal Medicine

## 2024-06-03 DIAGNOSIS — J4550 Severe persistent asthma, uncomplicated: Secondary | ICD-10-CM | POA: Diagnosis not present

## 2024-06-03 MED ORDER — MONTELUKAST SODIUM 10 MG PO TABS: ORAL_TABLET | ORAL | 4 refills | Status: AC

## 2024-06-03 NOTE — Telephone Encounter (Signed)
 Patient stated that the medication has expired and needs refills.Patient would like a scrip of Montelukast  (Singulair )10mg  tablet sent to [id number] - [place], [place] - [address]

## 2024-06-03 NOTE — Telephone Encounter (Signed)
 Rx sent to pharmacy

## 2024-06-11 ENCOUNTER — Telehealth: Payer: Self-pay

## 2024-06-11 NOTE — Telephone Encounter (Signed)
*  AA  Pharmacy Patient Advocate Encounter   Received notification from Fax that prior authorization for Budesonide  0.5MG /2ML suspension   is required/requested.   Insurance verification completed.   The patient is insured through [facility].   Per test claim: Medication is not eligible for pharmacy benefits and must be billed through medical insurance. As our team only handles pharmacy related prior auths, medical PA's must be submitted by the clinic. Thank you

## 2024-06-17 ENCOUNTER — Ambulatory Visit

## 2024-06-17 DIAGNOSIS — J4550 Severe persistent asthma, uncomplicated: Secondary | ICD-10-CM

## 2024-06-17 MED ORDER — OMALIZUMAB 300 MG/2  ML ~~LOC~~ SOSY
300.0000 mg | PREFILLED_SYRINGE | Freq: Once | SUBCUTANEOUS | Status: AC
  Administered 2024-06-17: 11:00:00 300 mg via SUBCUTANEOUS

## 2024-07-02 ENCOUNTER — Ambulatory Visit (INDEPENDENT_AMBULATORY_CARE_PROVIDER_SITE_OTHER)

## 2024-07-02 DIAGNOSIS — J4550 Severe persistent asthma, uncomplicated: Secondary | ICD-10-CM | POA: Diagnosis not present

## 2024-07-15 ENCOUNTER — Ambulatory Visit

## 2024-07-15 DIAGNOSIS — J4550 Severe persistent asthma, uncomplicated: Secondary | ICD-10-CM | POA: Diagnosis not present

## 2024-07-18 ENCOUNTER — Other Ambulatory Visit: Payer: Self-pay | Admitting: Internal Medicine

## 2024-07-29 ENCOUNTER — Ambulatory Visit

## 2024-07-31 ENCOUNTER — Ambulatory Visit (INDEPENDENT_AMBULATORY_CARE_PROVIDER_SITE_OTHER)

## 2024-07-31 DIAGNOSIS — J4550 Severe persistent asthma, uncomplicated: Secondary | ICD-10-CM

## 2024-08-07 ENCOUNTER — Encounter: Payer: Self-pay | Admitting: Internal Medicine

## 2024-08-07 ENCOUNTER — Ambulatory Visit: Admitting: Internal Medicine

## 2024-08-07 VITALS — BP 126/72 | HR 68 | Temp 97.5°F | Resp 20 | Wt 154.0 lb

## 2024-08-07 DIAGNOSIS — K219 Gastro-esophageal reflux disease without esophagitis: Secondary | ICD-10-CM

## 2024-08-07 DIAGNOSIS — J3089 Other allergic rhinitis: Secondary | ICD-10-CM | POA: Diagnosis not present

## 2024-08-07 DIAGNOSIS — J4550 Severe persistent asthma, uncomplicated: Secondary | ICD-10-CM | POA: Diagnosis not present

## 2024-08-07 DIAGNOSIS — Z952 Presence of prosthetic heart valve: Secondary | ICD-10-CM

## 2024-08-07 MED ORDER — ALBUTEROL SULFATE HFA 108 (90 BASE) MCG/ACT IN AERS: 2.0000 | INHALATION_SPRAY | RESPIRATORY_TRACT | 2 refills | Status: AC | PRN

## 2024-08-07 NOTE — Progress Notes (Signed)
 "  FOLLOW UP Date of Service/Encounter:   08/07/2024  Subjective:  Caleb Mills (DOB: Apr 18, 1951) is a 74 y.o. 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  For Review, LV was on 09/27/23  with Dr.Dwana Garin seen for acute visit for chest congestion. See below for summary of history and diagnostics.  ----------------------------------------------------- Pertinent History/Diagnostics:  Asthma/COPD overlap  historically uncontrolled-currently on Xolair , has been on benralizumab  in the past.  Trial of Tezspire  (felt his asthma got worse after trying this).   He says he hasn't been well since starting Tezspire  even though he only received 1 dose. He tried Fasenra  but it didn't seem to do anything for him.  Interestingly, his symptoms worsening coincides several months after having COVID in fall 2020.  He reportedly did well during COVID itself.  Xolair  has helped him with his breathing the most. .  He feels that nebulizers are more helpful than inhalers.  Reports that he has tried every inhaler available  High Res Chest CT 07/30/21: IMPRESSION:  1. No findings to suggest interstitial lung disease.  2. Aortic atherosclerosis, in addition to left main and three-vessel  coronary artery disease. Assessment for potential risk factor  modification, dietary therapy or pharmacologic therapy may be  warranted, if clinically indicated.  3. There are calcifications of the mitral annulus. Echocardiographic  correlation for evaluation of potential valvular dysfunction may be  warranted if clinically indicated.  4. Status post mechanical aortic valve replacement.  5. Additional incidental findings, as above.  Other visits:  -Full PFT  07/14/21:Severe obstructive defect and air trapping is indicated on this PFT. There is significant bronchodilator  response. Normal diffusing capacity -visit 08/06/21: asmanex added, continued anoro and albuterol , HRCT without evidence of ILD or ABPA, Aspergillus weakly positive, CBC and ANCA not obtained. FU 3 months - 11/11/21: Cardiology appointment- reported negative echo and stress test at [facility] not available for review; plan to obtain records; enrolled in INR clinic - Pulmonary visit 02/16/22: Yulperi started, CBC, ANCA, IgE ordered but not obtained. - pulm 08/31/22-CBCd checked to r/o anemia due to low DLCO and no anemia present. -Last visit pulm 05/25/2023.  Now following with Dr. Georgina with severe asthma clinic.  She is now managing his inhalers and asthma treatment plan with the exception of Xolair  which remains provided by our clinic. Current plan: per Dr. Georgina: albuterol  tablets 1 tablet twice daily.  TWICE daily: Albuterol  nebulizer treatment [name]  nebulizer Formoterol  nebulizer treatment  Budesonide  nebulizer treatment  During the day if you need it (or before you need it) - you can use albuterol  nebulizer or 2 puffs of Ventolin  - your choice azithromycin  1 tablet on M,W,F Continue magnesium at least 400 mg twice daily  Xolair  (with Allergy clinic) --------------------------------------------------- Today presents for follow-up. Discussed the use of AI scribe software for clinical note transcription with the patient, who gave verbal consent to proceed.  History of Present Illness Caleb Mills is a 74 year old male with asthma who presents with worsening respiratory symptoms.  Respiratory symptoms - Worsening respiratory symptoms since November, including increased congestion and chest tightness - Significant shortness of breath with minimal exertion - Increased mucus production - Nebulizers provide some relief - Resumed daily prednisone  and prophylactic antibiotics due to symptom exacerbation  Asthma triggers and functional impact - Asthma primarily triggered by exercise  and physical activity -  Significant limitation in ability to engage in daily activities due to symptoms  Biologic and inhaler therapy response - Previously treated with Fasenra  and Tezspire , both ineffective - Currently on Xolair , which initially reduced prednisone  requirement but symptoms have worsened over time - Xolair  provides relief for general allergies, but sneezing and coughing occur when not on it - Inhaler use of albuterol  is effective  Medication access and adherence - Challenges with insurance coverage and costs for inhaler and biologic treatments   Most recent CBC with differential on 01/24/2024: AEC 200.  Unclear if patient on systemic steroids when this was obtained.  Chart Review: LV Dr. Georgina 07/26/24: asthma uncontrolled, still on daily prednisone , dupixent discussed as an option Plan:  I think we should seriously considering changing to Dupixent. This would be 1 dose every 2 weeks given at home. We would discontinue the Xolair  once we get the Dupixent started. Do some research and then discuss her options with your family and Dr. Legrand and get back to me.  In the meantime continue Xolair  at the allergy office.  Continue your sameroutine again - TWICE daily Albuterol  nebulizer treatment Formoterol  nebulizer treatment  Budesonide  nebulizer treatment -restart when the situation is sorted out Wash your mouth out with water afterwards  During the day if you need it (or before you need it) - you can use albuterol  nebulizer or 2 puffs of Ventolin  - your choice  Continue azithromycin  = you decide based on how you are doing  -Monday, Wednesday and Friday Or - everyday tune up   All medications reviewed by clinical staff and updated in chart. No new pertinent medical or surgical history except as noted in HPI.  ROS: All others negative except as noted per HPI.   Objective:  BP 126/72 (BP Location: Right Arm, Patient Position: Sitting, Cuff Size: Normal)   Pulse  68   Temp (!) 97.5 F (36.4 C) (Oral)   Resp 20   Wt 154 lb (69.9 kg)   SpO2 96%   BMI 25.63 kg/m  Body mass index is 25.63 kg/m. Physical Exam: General Appearance:  Alert, cooperative, no distress, appears stated age  Head:  Normocephalic, without obvious abnormality, atraumatic  Eyes:  Conjunctiva clear, EOM's intact  Ears EACs normal bilaterally and normal TMs bilaterally  Nose: Nares normal, normal mucosa and no visible anterior polyps  Throat: Lips, tongue normal; teeth and gums normal, normal posterior oropharynx  Neck: Supple, symmetrical  Lungs:   end-expiratory wheezing, Respirations unlabored, no coughing  Heart:  regular rate and rhythm and no murmur, Appears well perfused  Extremities: No edema  Skin: Skin color, texture, turgor normal and no rashes or lesions on visualized portions of skin  Neurologic: No gross deficits   Labs:  Lab Orders  No laboratory test(s) ordered today    Spirometry:  Tracings reviewed. His effort: Good reproducible efforts. FVC: 2.25L FEV1: 0.78L, 30% predicted FEV1/FVC ratio: 0.35 Interpretation: Spirometry consistent with severe obstructive disease.  Please see scanned spirometry results for details.   Assessment/Plan   Asthma Plan-primarily managed by Dr. Georgina. Agree with switch to Dupixent.  Rest of plan per Pulmonary:  Continue your sameroutine again - TWICE daily Albuterol  nebulizer treatment Formoterol  nebulizer treatment  Budesonide  nebulizer treatment -restart when the situation is sorted out Wash your mouth out with water afterwards  During the day if you need it (or before you need it) - you can use albuterol  nebulizer or 2 puffs of Ventolin  - your choice  Continue azithromycin  =  you decide based on how you are doing  -Monday, Wednesday and Friday Or - everyday tune up   Allergic rhinitis-chronic and stable Continue Flonase 2 sprays in each nostril once a day as needed for a stuffy nose Continue azelastine  2 sprays in each nostril twice a day as needed for a runny nose Consider saline nasal rinses as needed for nasal symptoms. Use this before any medicated nasal sprays for best result Continue Claritin 10 mg once a day as needed for a runny nose Continue Mucinex 1200 mg twice a day and increase hydration as possible  Allergic conjunctivitis-chronic and stable Continue a lubricating eye drop Some over the counter eye drops include Pataday one drop in each eye once a day as needed for red, itchy eyes OR Zaditor one drop in each eye twice a day as needed for red itchy eyes. Continue to follow with ophthalmology for cataracts   Reflux-chronic and stable Continue famotidine  20 mg twice a day to control reflux Continue dietary and lifestyle modifications as listed below Continue to follow-up with Dr. Charlanne, GI specialist, as recommended  Follow up: 4-6 months, sooner if needed  Thank you so much for letting me partake in your care today.  Don't hesitate to reach out if you have any additional concerns!  Rocky Endow, MD [facility]  Other: none  Rocky Endow, MD  [facility]        "

## 2024-08-07 NOTE — Patient Instructions (Addendum)
 Asthma Plan-primarily managed by Dr. Georgina. Agree with switch to Dupixent.  Rest of plan per Pulmonary:  Continue your sameroutine again - TWICE daily Albuterol  nebulizer treatment Formoterol  nebulizer treatment  Budesonide  nebulizer treatment -restart when the situation is sorted out Wash your mouth out with water afterwards  During the day if you need it (or before you need it) - you can use albuterol  nebulizer or 2 puffs of Ventolin  - your choice  Continue azithromycin  = you decide based on how you are doing  -Monday, Wednesday and Friday Or - everyday tune up   Allergic rhinitis-chronic and stable Continue Flonase 2 sprays in each nostril once a day as needed for a stuffy nose Continue azelastine 2 sprays in each nostril twice a day as needed for a runny nose Consider saline nasal rinses as needed for nasal symptoms. Use this before any medicated nasal sprays for best result Continue Claritin 10 mg once a day as needed for a runny nose Continue Mucinex 1200 mg twice a day and increase hydration as possible  Allergic conjunctivitis-chronic and stable Continue a lubricating eye drop Some over the counter eye drops include Pataday one drop in each eye once a day as needed for red, itchy eyes OR Zaditor one drop in each eye twice a day as needed for red itchy eyes. Continue to follow with ophthalmology for cataracts   Reflux-chronic and stable Continue famotidine  20 mg twice a day to control reflux Continue dietary and lifestyle modifications as listed below Continue to follow-up with Dr. Charlanne, GI specialist, as recommended  Follow up: 4-6 months, sooner if needed  Thank you so much for letting me partake in your care today.  Don't hesitate to reach out if you have any additional concerns!  Rocky Endow, MD [facility]

## 2024-08-14 ENCOUNTER — Ambulatory Visit
